# Patient Record
Sex: Female | Born: 1945
Health system: Southern US, Community
[De-identification: ages and names within clinical notes are randomized; demographics above are authoritative.]

## PROBLEM LIST (undated history)

## (undated) DIAGNOSIS — K219 Gastro-esophageal reflux disease without esophagitis: Secondary | ICD-10-CM

## (undated) DIAGNOSIS — J45909 Unspecified asthma, uncomplicated: Secondary | ICD-10-CM

## (undated) DIAGNOSIS — F419 Anxiety disorder, unspecified: Secondary | ICD-10-CM

## (undated) DIAGNOSIS — F329 Major depressive disorder, single episode, unspecified: Secondary | ICD-10-CM

## (undated) DIAGNOSIS — I10 Essential (primary) hypertension: Secondary | ICD-10-CM

## (undated) DIAGNOSIS — E7800 Pure hypercholesterolemia, unspecified: Secondary | ICD-10-CM

## (undated) DIAGNOSIS — M797 Fibromyalgia: Secondary | ICD-10-CM

## (undated) DIAGNOSIS — I471 Supraventricular tachycardia: Secondary | ICD-10-CM

## (undated) DIAGNOSIS — E039 Hypothyroidism, unspecified: Secondary | ICD-10-CM

## (undated) DIAGNOSIS — G8929 Other chronic pain: Secondary | ICD-10-CM

## (undated) DIAGNOSIS — R519 Headache, unspecified: Secondary | ICD-10-CM

## (undated) DIAGNOSIS — M810 Age-related osteoporosis without current pathological fracture: Secondary | ICD-10-CM

## (undated) DIAGNOSIS — K589 Irritable bowel syndrome without diarrhea: Secondary | ICD-10-CM

## (undated) DIAGNOSIS — F32A Depression, unspecified: Secondary | ICD-10-CM

## (undated) DIAGNOSIS — C8590 Non-Hodgkin lymphoma, unspecified, unspecified site: Secondary | ICD-10-CM

## (undated) DIAGNOSIS — M1990 Unspecified osteoarthritis, unspecified site: Secondary | ICD-10-CM

## (undated) DIAGNOSIS — D649 Anemia, unspecified: Secondary | ICD-10-CM

## (undated) DIAGNOSIS — H1010 Acute atopic conjunctivitis, unspecified eye: Secondary | ICD-10-CM

## (undated) DIAGNOSIS — T7840XA Allergy, unspecified, initial encounter: Secondary | ICD-10-CM

## (undated) DIAGNOSIS — I4719 Other supraventricular tachycardia: Secondary | ICD-10-CM

## (undated) DIAGNOSIS — N390 Urinary tract infection, site not specified: Secondary | ICD-10-CM

## (undated) DIAGNOSIS — K635 Polyp of colon: Secondary | ICD-10-CM

## (undated) DIAGNOSIS — R51 Headache: Secondary | ICD-10-CM

## (undated) DIAGNOSIS — K269 Duodenal ulcer, unspecified as acute or chronic, without hemorrhage or perforation: Secondary | ICD-10-CM

## (undated) DIAGNOSIS — J309 Allergic rhinitis, unspecified: Secondary | ICD-10-CM

## (undated) DIAGNOSIS — H269 Unspecified cataract: Secondary | ICD-10-CM

## (undated) HISTORY — DX: Unspecified asthma, uncomplicated: J45.909

## (undated) HISTORY — DX: Allergy, unspecified, initial encounter: T78.40XA

## (undated) HISTORY — DX: Non-Hodgkin lymphoma, unspecified, unspecified site: C85.90

## (undated) HISTORY — DX: Irritable bowel syndrome, unspecified: K58.9

## (undated) HISTORY — DX: Hypothyroidism, unspecified: E03.9

## (undated) HISTORY — DX: Major depressive disorder, single episode, unspecified: F32.9

## (undated) HISTORY — DX: Other chronic pain: G89.29

## (undated) HISTORY — DX: Unspecified cataract: H26.9

## (undated) HISTORY — DX: Fibromyalgia: M79.7

## (undated) HISTORY — PX: UPPER GASTROINTESTINAL ENDOSCOPY: SHX188

## (undated) HISTORY — DX: Other supraventricular tachycardia: I47.19

## (undated) HISTORY — DX: Depression, unspecified: F32.A

## (undated) HISTORY — DX: Acute atopic conjunctivitis, unspecified eye: H10.10

## (undated) HISTORY — DX: Age-related osteoporosis without current pathological fracture: M81.0

## (undated) HISTORY — DX: Essential (primary) hypertension: I10

## (undated) HISTORY — DX: Anemia, unspecified: D64.9

## (undated) HISTORY — DX: Gastro-esophageal reflux disease without esophagitis: K21.9

## (undated) HISTORY — DX: Allergic rhinitis, unspecified: J30.9

## (undated) HISTORY — DX: Unspecified osteoarthritis, unspecified site: M19.90

## (undated) HISTORY — PX: ADENOIDECTOMY: SUR15

## (undated) HISTORY — DX: Duodenal ulcer, unspecified as acute or chronic, without hemorrhage or perforation: K26.9

## (undated) HISTORY — DX: Urinary tract infection, site not specified: N39.0

## (undated) HISTORY — PX: TONSILLECTOMY: SUR1361

## (undated) HISTORY — DX: Anxiety disorder, unspecified: F41.9

## (undated) HISTORY — DX: Headache, unspecified: R51.9

## (undated) HISTORY — DX: Headache: R51

## (undated) HISTORY — DX: Supraventricular tachycardia: I47.1

## (undated) HISTORY — DX: Polyp of colon: K63.5

## (undated) HISTORY — DX: Pure hypercholesterolemia, unspecified: E78.00

---

## 1980-09-10 HISTORY — PX: TUBAL LIGATION: SHX77

## 1982-09-10 HISTORY — PX: THYROIDECTOMY: SHX17

## 1998-09-10 HISTORY — PX: ABLATION: SHX5711

## 2000-09-10 HISTORY — PX: BLADDER SURGERY: SHX569

## 2009-09-10 HISTORY — PX: KNEE ARTHROSCOPY: SUR90

## 2009-12-14 HISTORY — PX: COLONOSCOPY: SHX174

## 2010-06-16 ENCOUNTER — Ambulatory Visit (HOSPITAL_BASED_OUTPATIENT_CLINIC_OR_DEPARTMENT_OTHER): Admission: RE | Admit: 2010-06-16 | Discharge: 2010-06-16 | Payer: Self-pay | Admitting: Specialist

## 2010-11-23 LAB — POCT HEMOGLOBIN-HEMACUE: Hemoglobin: 12.3 g/dL (ref 12.0–15.0)

## 2011-10-31 DIAGNOSIS — D509 Iron deficiency anemia, unspecified: Secondary | ICD-10-CM | POA: Diagnosis not present

## 2011-10-31 DIAGNOSIS — M810 Age-related osteoporosis without current pathological fracture: Secondary | ICD-10-CM | POA: Diagnosis not present

## 2011-10-31 DIAGNOSIS — J0100 Acute maxillary sinusitis, unspecified: Secondary | ICD-10-CM | POA: Diagnosis not present

## 2011-10-31 DIAGNOSIS — R030 Elevated blood-pressure reading, without diagnosis of hypertension: Secondary | ICD-10-CM | POA: Diagnosis not present

## 2011-12-27 DIAGNOSIS — M62838 Other muscle spasm: Secondary | ICD-10-CM | POA: Diagnosis not present

## 2011-12-27 DIAGNOSIS — R5383 Other fatigue: Secondary | ICD-10-CM | POA: Diagnosis not present

## 2011-12-27 DIAGNOSIS — IMO0001 Reserved for inherently not codable concepts without codable children: Secondary | ICD-10-CM | POA: Diagnosis not present

## 2011-12-27 DIAGNOSIS — R5381 Other malaise: Secondary | ICD-10-CM | POA: Diagnosis not present

## 2011-12-27 DIAGNOSIS — M76899 Other specified enthesopathies of unspecified lower limb, excluding foot: Secondary | ICD-10-CM | POA: Diagnosis not present

## 2012-01-28 DIAGNOSIS — R030 Elevated blood-pressure reading, without diagnosis of hypertension: Secondary | ICD-10-CM | POA: Diagnosis not present

## 2012-01-28 DIAGNOSIS — J309 Allergic rhinitis, unspecified: Secondary | ICD-10-CM | POA: Diagnosis not present

## 2012-01-28 DIAGNOSIS — D509 Iron deficiency anemia, unspecified: Secondary | ICD-10-CM | POA: Diagnosis not present

## 2012-01-28 DIAGNOSIS — Z23 Encounter for immunization: Secondary | ICD-10-CM | POA: Diagnosis not present

## 2012-01-28 DIAGNOSIS — E039 Hypothyroidism, unspecified: Secondary | ICD-10-CM | POA: Diagnosis not present

## 2012-01-28 DIAGNOSIS — E7800 Pure hypercholesterolemia, unspecified: Secondary | ICD-10-CM | POA: Diagnosis not present

## 2012-04-30 DIAGNOSIS — M159 Polyosteoarthritis, unspecified: Secondary | ICD-10-CM | POA: Diagnosis not present

## 2012-04-30 DIAGNOSIS — D509 Iron deficiency anemia, unspecified: Secondary | ICD-10-CM | POA: Diagnosis not present

## 2012-04-30 DIAGNOSIS — R030 Elevated blood-pressure reading, without diagnosis of hypertension: Secondary | ICD-10-CM | POA: Diagnosis not present

## 2012-04-30 DIAGNOSIS — E039 Hypothyroidism, unspecified: Secondary | ICD-10-CM | POA: Diagnosis not present

## 2012-07-03 DIAGNOSIS — M542 Cervicalgia: Secondary | ICD-10-CM | POA: Diagnosis not present

## 2012-07-03 DIAGNOSIS — M76899 Other specified enthesopathies of unspecified lower limb, excluding foot: Secondary | ICD-10-CM | POA: Diagnosis not present

## 2012-07-03 DIAGNOSIS — R5383 Other fatigue: Secondary | ICD-10-CM | POA: Diagnosis not present

## 2012-07-03 DIAGNOSIS — G4700 Insomnia, unspecified: Secondary | ICD-10-CM | POA: Diagnosis not present

## 2012-09-08 DIAGNOSIS — Z6826 Body mass index (BMI) 26.0-26.9, adult: Secondary | ICD-10-CM | POA: Diagnosis not present

## 2012-09-08 DIAGNOSIS — R030 Elevated blood-pressure reading, without diagnosis of hypertension: Secondary | ICD-10-CM | POA: Diagnosis not present

## 2012-09-08 DIAGNOSIS — R1011 Right upper quadrant pain: Secondary | ICD-10-CM | POA: Diagnosis not present

## 2012-09-08 DIAGNOSIS — D509 Iron deficiency anemia, unspecified: Secondary | ICD-10-CM | POA: Diagnosis not present

## 2012-09-08 DIAGNOSIS — Z23 Encounter for immunization: Secondary | ICD-10-CM | POA: Diagnosis not present

## 2012-09-11 DIAGNOSIS — R1011 Right upper quadrant pain: Secondary | ICD-10-CM | POA: Diagnosis not present

## 2012-09-22 DIAGNOSIS — E7800 Pure hypercholesterolemia, unspecified: Secondary | ICD-10-CM | POA: Diagnosis not present

## 2012-09-22 DIAGNOSIS — Z6826 Body mass index (BMI) 26.0-26.9, adult: Secondary | ICD-10-CM | POA: Diagnosis not present

## 2012-09-22 DIAGNOSIS — Z1331 Encounter for screening for depression: Secondary | ICD-10-CM | POA: Diagnosis not present

## 2012-09-22 DIAGNOSIS — E118 Type 2 diabetes mellitus with unspecified complications: Secondary | ICD-10-CM | POA: Diagnosis not present

## 2012-09-22 DIAGNOSIS — M810 Age-related osteoporosis without current pathological fracture: Secondary | ICD-10-CM | POA: Diagnosis not present

## 2012-09-22 DIAGNOSIS — D509 Iron deficiency anemia, unspecified: Secondary | ICD-10-CM | POA: Diagnosis not present

## 2012-09-22 DIAGNOSIS — R030 Elevated blood-pressure reading, without diagnosis of hypertension: Secondary | ICD-10-CM | POA: Diagnosis not present

## 2012-09-22 DIAGNOSIS — E039 Hypothyroidism, unspecified: Secondary | ICD-10-CM | POA: Diagnosis not present

## 2012-09-22 DIAGNOSIS — Z9181 History of falling: Secondary | ICD-10-CM | POA: Diagnosis not present

## 2012-10-07 DIAGNOSIS — R1011 Right upper quadrant pain: Secondary | ICD-10-CM | POA: Diagnosis not present

## 2012-10-07 DIAGNOSIS — K589 Irritable bowel syndrome without diarrhea: Secondary | ICD-10-CM | POA: Diagnosis not present

## 2012-10-07 DIAGNOSIS — K219 Gastro-esophageal reflux disease without esophagitis: Secondary | ICD-10-CM | POA: Diagnosis not present

## 2012-11-10 DIAGNOSIS — K219 Gastro-esophageal reflux disease without esophagitis: Secondary | ICD-10-CM | POA: Diagnosis not present

## 2012-11-10 DIAGNOSIS — D131 Benign neoplasm of stomach: Secondary | ICD-10-CM | POA: Diagnosis not present

## 2012-11-10 DIAGNOSIS — J309 Allergic rhinitis, unspecified: Secondary | ICD-10-CM | POA: Diagnosis not present

## 2012-11-10 DIAGNOSIS — Z79899 Other long term (current) drug therapy: Secondary | ICD-10-CM | POA: Diagnosis not present

## 2012-11-10 DIAGNOSIS — D126 Benign neoplasm of colon, unspecified: Secondary | ICD-10-CM | POA: Diagnosis not present

## 2012-11-10 DIAGNOSIS — K589 Irritable bowel syndrome without diarrhea: Secondary | ICD-10-CM | POA: Diagnosis not present

## 2012-11-10 DIAGNOSIS — F329 Major depressive disorder, single episode, unspecified: Secondary | ICD-10-CM | POA: Diagnosis not present

## 2012-11-10 DIAGNOSIS — R1013 Epigastric pain: Secondary | ICD-10-CM | POA: Diagnosis not present

## 2012-11-10 DIAGNOSIS — IMO0001 Reserved for inherently not codable concepts without codable children: Secondary | ICD-10-CM | POA: Diagnosis not present

## 2012-11-10 DIAGNOSIS — E785 Hyperlipidemia, unspecified: Secondary | ICD-10-CM | POA: Diagnosis not present

## 2012-11-10 DIAGNOSIS — E7800 Pure hypercholesterolemia, unspecified: Secondary | ICD-10-CM | POA: Diagnosis not present

## 2012-11-10 DIAGNOSIS — K449 Diaphragmatic hernia without obstruction or gangrene: Secondary | ICD-10-CM | POA: Diagnosis not present

## 2012-11-10 DIAGNOSIS — M818 Other osteoporosis without current pathological fracture: Secondary | ICD-10-CM | POA: Diagnosis not present

## 2012-11-10 DIAGNOSIS — R1011 Right upper quadrant pain: Secondary | ICD-10-CM | POA: Diagnosis not present

## 2012-11-10 HISTORY — PX: ESOPHAGOGASTRODUODENOSCOPY: SHX1529

## 2012-11-12 DIAGNOSIS — R1011 Right upper quadrant pain: Secondary | ICD-10-CM | POA: Diagnosis not present

## 2013-01-09 DIAGNOSIS — M6530 Trigger finger, unspecified finger: Secondary | ICD-10-CM | POA: Diagnosis not present

## 2013-01-09 DIAGNOSIS — R5383 Other fatigue: Secondary | ICD-10-CM | POA: Diagnosis not present

## 2013-01-09 DIAGNOSIS — M19049 Primary osteoarthritis, unspecified hand: Secondary | ICD-10-CM | POA: Diagnosis not present

## 2013-01-09 DIAGNOSIS — IMO0001 Reserved for inherently not codable concepts without codable children: Secondary | ICD-10-CM | POA: Diagnosis not present

## 2013-01-09 DIAGNOSIS — R5381 Other malaise: Secondary | ICD-10-CM | POA: Diagnosis not present

## 2013-01-24 DIAGNOSIS — D509 Iron deficiency anemia, unspecified: Secondary | ICD-10-CM | POA: Diagnosis not present

## 2013-01-24 DIAGNOSIS — M810 Age-related osteoporosis without current pathological fracture: Secondary | ICD-10-CM | POA: Diagnosis not present

## 2013-01-24 DIAGNOSIS — Z6827 Body mass index (BMI) 27.0-27.9, adult: Secondary | ICD-10-CM | POA: Diagnosis not present

## 2013-01-24 DIAGNOSIS — R030 Elevated blood-pressure reading, without diagnosis of hypertension: Secondary | ICD-10-CM | POA: Diagnosis not present

## 2013-03-04 DIAGNOSIS — D509 Iron deficiency anemia, unspecified: Secondary | ICD-10-CM | POA: Diagnosis not present

## 2013-03-04 DIAGNOSIS — Z6828 Body mass index (BMI) 28.0-28.9, adult: Secondary | ICD-10-CM | POA: Diagnosis not present

## 2013-03-04 DIAGNOSIS — R05 Cough: Secondary | ICD-10-CM | POA: Diagnosis not present

## 2013-03-04 DIAGNOSIS — M810 Age-related osteoporosis without current pathological fracture: Secondary | ICD-10-CM | POA: Diagnosis not present

## 2013-03-18 DIAGNOSIS — M810 Age-related osteoporosis without current pathological fracture: Secondary | ICD-10-CM | POA: Diagnosis not present

## 2013-03-18 DIAGNOSIS — Z1231 Encounter for screening mammogram for malignant neoplasm of breast: Secondary | ICD-10-CM | POA: Diagnosis not present

## 2013-04-03 DIAGNOSIS — E7800 Pure hypercholesterolemia, unspecified: Secondary | ICD-10-CM | POA: Diagnosis not present

## 2013-04-03 DIAGNOSIS — Z6828 Body mass index (BMI) 28.0-28.9, adult: Secondary | ICD-10-CM | POA: Diagnosis not present

## 2013-04-03 DIAGNOSIS — Z23 Encounter for immunization: Secondary | ICD-10-CM | POA: Diagnosis not present

## 2013-04-03 DIAGNOSIS — M810 Age-related osteoporosis without current pathological fracture: Secondary | ICD-10-CM | POA: Diagnosis not present

## 2013-04-03 DIAGNOSIS — D509 Iron deficiency anemia, unspecified: Secondary | ICD-10-CM | POA: Diagnosis not present

## 2013-04-03 DIAGNOSIS — R05 Cough: Secondary | ICD-10-CM | POA: Diagnosis not present

## 2013-04-03 DIAGNOSIS — E039 Hypothyroidism, unspecified: Secondary | ICD-10-CM | POA: Diagnosis not present

## 2013-05-04 DIAGNOSIS — E039 Hypothyroidism, unspecified: Secondary | ICD-10-CM | POA: Diagnosis not present

## 2013-05-04 DIAGNOSIS — Z6828 Body mass index (BMI) 28.0-28.9, adult: Secondary | ICD-10-CM | POA: Diagnosis not present

## 2013-05-04 DIAGNOSIS — R030 Elevated blood-pressure reading, without diagnosis of hypertension: Secondary | ICD-10-CM | POA: Diagnosis not present

## 2013-05-04 DIAGNOSIS — D509 Iron deficiency anemia, unspecified: Secondary | ICD-10-CM | POA: Diagnosis not present

## 2013-06-16 DIAGNOSIS — M6530 Trigger finger, unspecified finger: Secondary | ICD-10-CM | POA: Diagnosis not present

## 2013-06-16 DIAGNOSIS — IMO0001 Reserved for inherently not codable concepts without codable children: Secondary | ICD-10-CM | POA: Diagnosis not present

## 2013-06-16 DIAGNOSIS — M25569 Pain in unspecified knee: Secondary | ICD-10-CM | POA: Diagnosis not present

## 2013-06-16 DIAGNOSIS — G4700 Insomnia, unspecified: Secondary | ICD-10-CM | POA: Diagnosis not present

## 2013-08-04 DIAGNOSIS — Z6831 Body mass index (BMI) 31.0-31.9, adult: Secondary | ICD-10-CM | POA: Diagnosis not present

## 2013-08-04 DIAGNOSIS — D509 Iron deficiency anemia, unspecified: Secondary | ICD-10-CM | POA: Diagnosis not present

## 2013-08-04 DIAGNOSIS — R030 Elevated blood-pressure reading, without diagnosis of hypertension: Secondary | ICD-10-CM | POA: Diagnosis not present

## 2013-08-04 DIAGNOSIS — Z23 Encounter for immunization: Secondary | ICD-10-CM | POA: Diagnosis not present

## 2013-08-04 DIAGNOSIS — M810 Age-related osteoporosis without current pathological fracture: Secondary | ICD-10-CM | POA: Diagnosis not present

## 2013-08-12 DIAGNOSIS — M1710 Unilateral primary osteoarthritis, unspecified knee: Secondary | ICD-10-CM | POA: Diagnosis not present

## 2013-08-19 DIAGNOSIS — M1710 Unilateral primary osteoarthritis, unspecified knee: Secondary | ICD-10-CM | POA: Diagnosis not present

## 2013-08-26 DIAGNOSIS — M1710 Unilateral primary osteoarthritis, unspecified knee: Secondary | ICD-10-CM | POA: Diagnosis not present

## 2013-09-02 DIAGNOSIS — M1710 Unilateral primary osteoarthritis, unspecified knee: Secondary | ICD-10-CM | POA: Diagnosis not present

## 2013-09-09 DIAGNOSIS — M1710 Unilateral primary osteoarthritis, unspecified knee: Secondary | ICD-10-CM | POA: Diagnosis not present

## 2013-09-23 DIAGNOSIS — R51 Headache: Secondary | ICD-10-CM | POA: Diagnosis not present

## 2013-11-02 DIAGNOSIS — E7800 Pure hypercholesterolemia, unspecified: Secondary | ICD-10-CM | POA: Diagnosis not present

## 2013-11-02 DIAGNOSIS — D509 Iron deficiency anemia, unspecified: Secondary | ICD-10-CM | POA: Diagnosis not present

## 2013-11-02 DIAGNOSIS — E039 Hypothyroidism, unspecified: Secondary | ICD-10-CM | POA: Diagnosis not present

## 2013-11-02 DIAGNOSIS — R030 Elevated blood-pressure reading, without diagnosis of hypertension: Secondary | ICD-10-CM | POA: Diagnosis not present

## 2013-11-02 DIAGNOSIS — J309 Allergic rhinitis, unspecified: Secondary | ICD-10-CM | POA: Diagnosis not present

## 2013-11-02 DIAGNOSIS — Z1331 Encounter for screening for depression: Secondary | ICD-10-CM | POA: Diagnosis not present

## 2013-11-02 DIAGNOSIS — Z9181 History of falling: Secondary | ICD-10-CM | POA: Diagnosis not present

## 2013-12-15 DIAGNOSIS — IMO0001 Reserved for inherently not codable concepts without codable children: Secondary | ICD-10-CM | POA: Diagnosis not present

## 2013-12-15 DIAGNOSIS — M461 Sacroiliitis, not elsewhere classified: Secondary | ICD-10-CM | POA: Diagnosis not present

## 2013-12-15 DIAGNOSIS — R5381 Other malaise: Secondary | ICD-10-CM | POA: Diagnosis not present

## 2013-12-15 DIAGNOSIS — G4700 Insomnia, unspecified: Secondary | ICD-10-CM | POA: Diagnosis not present

## 2013-12-15 DIAGNOSIS — R5383 Other fatigue: Secondary | ICD-10-CM | POA: Diagnosis not present

## 2014-02-10 DIAGNOSIS — E039 Hypothyroidism, unspecified: Secondary | ICD-10-CM | POA: Diagnosis not present

## 2014-02-10 DIAGNOSIS — R030 Elevated blood-pressure reading, without diagnosis of hypertension: Secondary | ICD-10-CM | POA: Diagnosis not present

## 2014-02-10 DIAGNOSIS — J309 Allergic rhinitis, unspecified: Secondary | ICD-10-CM | POA: Diagnosis not present

## 2014-02-10 DIAGNOSIS — D509 Iron deficiency anemia, unspecified: Secondary | ICD-10-CM | POA: Diagnosis not present

## 2014-05-13 DIAGNOSIS — J309 Allergic rhinitis, unspecified: Secondary | ICD-10-CM | POA: Diagnosis not present

## 2014-05-13 DIAGNOSIS — R030 Elevated blood-pressure reading, without diagnosis of hypertension: Secondary | ICD-10-CM | POA: Diagnosis not present

## 2014-05-13 DIAGNOSIS — E039 Hypothyroidism, unspecified: Secondary | ICD-10-CM | POA: Diagnosis not present

## 2014-05-13 DIAGNOSIS — D509 Iron deficiency anemia, unspecified: Secondary | ICD-10-CM | POA: Diagnosis not present

## 2014-05-13 DIAGNOSIS — E7800 Pure hypercholesterolemia, unspecified: Secondary | ICD-10-CM | POA: Diagnosis not present

## 2014-05-13 DIAGNOSIS — K219 Gastro-esophageal reflux disease without esophagitis: Secondary | ICD-10-CM | POA: Diagnosis not present

## 2014-05-27 DIAGNOSIS — Z1231 Encounter for screening mammogram for malignant neoplasm of breast: Secondary | ICD-10-CM | POA: Diagnosis not present

## 2014-07-05 DIAGNOSIS — M65341 Trigger finger, right ring finger: Secondary | ICD-10-CM | POA: Diagnosis not present

## 2014-07-05 DIAGNOSIS — M170 Bilateral primary osteoarthritis of knee: Secondary | ICD-10-CM | POA: Diagnosis not present

## 2014-07-05 DIAGNOSIS — M65331 Trigger finger, right middle finger: Secondary | ICD-10-CM | POA: Diagnosis not present

## 2014-07-05 DIAGNOSIS — M797 Fibromyalgia: Secondary | ICD-10-CM | POA: Diagnosis not present

## 2014-08-11 DIAGNOSIS — M170 Bilateral primary osteoarthritis of knee: Secondary | ICD-10-CM | POA: Diagnosis not present

## 2014-08-18 DIAGNOSIS — M1712 Unilateral primary osteoarthritis, left knee: Secondary | ICD-10-CM | POA: Diagnosis not present

## 2014-08-18 DIAGNOSIS — K219 Gastro-esophageal reflux disease without esophagitis: Secondary | ICD-10-CM | POA: Diagnosis not present

## 2014-08-18 DIAGNOSIS — M159 Polyosteoarthritis, unspecified: Secondary | ICD-10-CM | POA: Diagnosis not present

## 2014-08-18 DIAGNOSIS — M1711 Unilateral primary osteoarthritis, right knee: Secondary | ICD-10-CM | POA: Diagnosis not present

## 2014-08-18 DIAGNOSIS — E039 Hypothyroidism, unspecified: Secondary | ICD-10-CM | POA: Diagnosis not present

## 2014-08-18 DIAGNOSIS — R030 Elevated blood-pressure reading, without diagnosis of hypertension: Secondary | ICD-10-CM | POA: Diagnosis not present

## 2014-08-18 DIAGNOSIS — Z23 Encounter for immunization: Secondary | ICD-10-CM | POA: Diagnosis not present

## 2014-08-18 DIAGNOSIS — D509 Iron deficiency anemia, unspecified: Secondary | ICD-10-CM | POA: Diagnosis not present

## 2014-08-18 DIAGNOSIS — F329 Major depressive disorder, single episode, unspecified: Secondary | ICD-10-CM | POA: Diagnosis not present

## 2014-08-18 DIAGNOSIS — E780 Pure hypercholesterolemia: Secondary | ICD-10-CM | POA: Diagnosis not present

## 2014-08-18 DIAGNOSIS — D229 Melanocytic nevi, unspecified: Secondary | ICD-10-CM | POA: Diagnosis not present

## 2014-08-18 DIAGNOSIS — J309 Allergic rhinitis, unspecified: Secondary | ICD-10-CM | POA: Diagnosis not present

## 2014-08-25 DIAGNOSIS — M1712 Unilateral primary osteoarthritis, left knee: Secondary | ICD-10-CM | POA: Diagnosis not present

## 2014-08-25 DIAGNOSIS — M1711 Unilateral primary osteoarthritis, right knee: Secondary | ICD-10-CM | POA: Diagnosis not present

## 2014-09-01 DIAGNOSIS — M1711 Unilateral primary osteoarthritis, right knee: Secondary | ICD-10-CM | POA: Diagnosis not present

## 2014-09-01 DIAGNOSIS — M1712 Unilateral primary osteoarthritis, left knee: Secondary | ICD-10-CM | POA: Diagnosis not present

## 2014-09-09 DIAGNOSIS — M1712 Unilateral primary osteoarthritis, left knee: Secondary | ICD-10-CM | POA: Diagnosis not present

## 2014-09-09 DIAGNOSIS — M1711 Unilateral primary osteoarthritis, right knee: Secondary | ICD-10-CM | POA: Diagnosis not present

## 2014-09-10 HISTORY — PX: CATARACT EXTRACTION: SUR2

## 2014-09-20 DIAGNOSIS — H9319 Tinnitus, unspecified ear: Secondary | ICD-10-CM | POA: Diagnosis not present

## 2014-09-20 DIAGNOSIS — H9313 Tinnitus, bilateral: Secondary | ICD-10-CM | POA: Diagnosis not present

## 2014-09-20 DIAGNOSIS — H9042 Sensorineural hearing loss, unilateral, left ear, with unrestricted hearing on the contralateral side: Secondary | ICD-10-CM | POA: Diagnosis not present

## 2014-09-20 DIAGNOSIS — H903 Sensorineural hearing loss, bilateral: Secondary | ICD-10-CM | POA: Diagnosis not present

## 2014-09-20 DIAGNOSIS — H9071 Mixed conductive and sensorineural hearing loss, unilateral, right ear, with unrestricted hearing on the contralateral side: Secondary | ICD-10-CM | POA: Diagnosis not present

## 2014-09-27 DIAGNOSIS — H2513 Age-related nuclear cataract, bilateral: Secondary | ICD-10-CM | POA: Diagnosis not present

## 2014-09-27 DIAGNOSIS — G43809 Other migraine, not intractable, without status migrainosus: Secondary | ICD-10-CM | POA: Diagnosis not present

## 2014-10-06 DIAGNOSIS — L820 Inflamed seborrheic keratosis: Secondary | ICD-10-CM | POA: Diagnosis not present

## 2014-10-06 DIAGNOSIS — D225 Melanocytic nevi of trunk: Secondary | ICD-10-CM | POA: Diagnosis not present

## 2014-11-17 DIAGNOSIS — E039 Hypothyroidism, unspecified: Secondary | ICD-10-CM | POA: Diagnosis not present

## 2014-11-17 DIAGNOSIS — F329 Major depressive disorder, single episode, unspecified: Secondary | ICD-10-CM | POA: Diagnosis not present

## 2014-11-17 DIAGNOSIS — D509 Iron deficiency anemia, unspecified: Secondary | ICD-10-CM | POA: Diagnosis not present

## 2014-11-17 DIAGNOSIS — K219 Gastro-esophageal reflux disease without esophagitis: Secondary | ICD-10-CM | POA: Diagnosis not present

## 2014-11-17 DIAGNOSIS — E669 Obesity, unspecified: Secondary | ICD-10-CM | POA: Diagnosis not present

## 2014-11-17 DIAGNOSIS — E030 Congenital hypothyroidism with diffuse goiter: Secondary | ICD-10-CM | POA: Diagnosis not present

## 2014-11-17 DIAGNOSIS — R030 Elevated blood-pressure reading, without diagnosis of hypertension: Secondary | ICD-10-CM | POA: Diagnosis not present

## 2014-11-17 DIAGNOSIS — Z9181 History of falling: Secondary | ICD-10-CM | POA: Diagnosis not present

## 2014-11-17 DIAGNOSIS — J309 Allergic rhinitis, unspecified: Secondary | ICD-10-CM | POA: Diagnosis not present

## 2014-11-17 DIAGNOSIS — M810 Age-related osteoporosis without current pathological fracture: Secondary | ICD-10-CM | POA: Diagnosis not present

## 2014-11-17 DIAGNOSIS — M159 Polyosteoarthritis, unspecified: Secondary | ICD-10-CM | POA: Diagnosis not present

## 2014-11-17 DIAGNOSIS — E780 Pure hypercholesterolemia: Secondary | ICD-10-CM | POA: Diagnosis not present

## 2014-11-17 DIAGNOSIS — Z1389 Encounter for screening for other disorder: Secondary | ICD-10-CM | POA: Diagnosis not present

## 2014-11-24 DIAGNOSIS — M19042 Primary osteoarthritis, left hand: Secondary | ICD-10-CM | POA: Diagnosis not present

## 2014-11-24 DIAGNOSIS — M19041 Primary osteoarthritis, right hand: Secondary | ICD-10-CM | POA: Diagnosis not present

## 2015-01-11 DIAGNOSIS — H3530 Unspecified macular degeneration: Secondary | ICD-10-CM | POA: Diagnosis not present

## 2015-01-11 DIAGNOSIS — H2511 Age-related nuclear cataract, right eye: Secondary | ICD-10-CM | POA: Diagnosis not present

## 2015-01-17 DIAGNOSIS — E039 Hypothyroidism, unspecified: Secondary | ICD-10-CM | POA: Diagnosis not present

## 2015-01-17 DIAGNOSIS — J209 Acute bronchitis, unspecified: Secondary | ICD-10-CM | POA: Diagnosis not present

## 2015-01-17 DIAGNOSIS — Z6833 Body mass index (BMI) 33.0-33.9, adult: Secondary | ICD-10-CM | POA: Diagnosis not present

## 2015-01-17 DIAGNOSIS — E780 Pure hypercholesterolemia: Secondary | ICD-10-CM | POA: Diagnosis not present

## 2015-01-17 DIAGNOSIS — F329 Major depressive disorder, single episode, unspecified: Secondary | ICD-10-CM | POA: Diagnosis not present

## 2015-01-17 DIAGNOSIS — M159 Polyosteoarthritis, unspecified: Secondary | ICD-10-CM | POA: Diagnosis not present

## 2015-01-17 DIAGNOSIS — E669 Obesity, unspecified: Secondary | ICD-10-CM | POA: Diagnosis not present

## 2015-01-17 DIAGNOSIS — J309 Allergic rhinitis, unspecified: Secondary | ICD-10-CM | POA: Diagnosis not present

## 2015-01-17 DIAGNOSIS — M810 Age-related osteoporosis without current pathological fracture: Secondary | ICD-10-CM | POA: Diagnosis not present

## 2015-01-17 DIAGNOSIS — D509 Iron deficiency anemia, unspecified: Secondary | ICD-10-CM | POA: Diagnosis not present

## 2015-01-17 DIAGNOSIS — K219 Gastro-esophageal reflux disease without esophagitis: Secondary | ICD-10-CM | POA: Diagnosis not present

## 2015-01-17 DIAGNOSIS — R030 Elevated blood-pressure reading, without diagnosis of hypertension: Secondary | ICD-10-CM | POA: Diagnosis not present

## 2015-02-01 DIAGNOSIS — E785 Hyperlipidemia, unspecified: Secondary | ICD-10-CM | POA: Diagnosis not present

## 2015-02-01 DIAGNOSIS — E039 Hypothyroidism, unspecified: Secondary | ICD-10-CM | POA: Diagnosis not present

## 2015-02-01 DIAGNOSIS — M797 Fibromyalgia: Secondary | ICD-10-CM | POA: Diagnosis not present

## 2015-02-01 DIAGNOSIS — H259 Unspecified age-related cataract: Secondary | ICD-10-CM | POA: Diagnosis not present

## 2015-02-01 DIAGNOSIS — I10 Essential (primary) hypertension: Secondary | ICD-10-CM | POA: Diagnosis not present

## 2015-02-01 DIAGNOSIS — K219 Gastro-esophageal reflux disease without esophagitis: Secondary | ICD-10-CM | POA: Diagnosis not present

## 2015-02-01 DIAGNOSIS — Z79899 Other long term (current) drug therapy: Secondary | ICD-10-CM | POA: Diagnosis not present

## 2015-02-01 DIAGNOSIS — H2511 Age-related nuclear cataract, right eye: Secondary | ICD-10-CM | POA: Diagnosis not present

## 2015-02-01 DIAGNOSIS — M810 Age-related osteoporosis without current pathological fracture: Secondary | ICD-10-CM | POA: Diagnosis not present

## 2015-02-01 DIAGNOSIS — Z7982 Long term (current) use of aspirin: Secondary | ICD-10-CM | POA: Diagnosis not present

## 2015-02-17 DIAGNOSIS — J309 Allergic rhinitis, unspecified: Secondary | ICD-10-CM | POA: Diagnosis not present

## 2015-02-17 DIAGNOSIS — F329 Major depressive disorder, single episode, unspecified: Secondary | ICD-10-CM | POA: Diagnosis not present

## 2015-02-17 DIAGNOSIS — D509 Iron deficiency anemia, unspecified: Secondary | ICD-10-CM | POA: Diagnosis not present

## 2015-02-17 DIAGNOSIS — E780 Pure hypercholesterolemia: Secondary | ICD-10-CM | POA: Diagnosis not present

## 2015-02-17 DIAGNOSIS — M159 Polyosteoarthritis, unspecified: Secondary | ICD-10-CM | POA: Diagnosis not present

## 2015-02-17 DIAGNOSIS — R030 Elevated blood-pressure reading, without diagnosis of hypertension: Secondary | ICD-10-CM | POA: Diagnosis not present

## 2015-02-17 DIAGNOSIS — Z6831 Body mass index (BMI) 31.0-31.9, adult: Secondary | ICD-10-CM | POA: Diagnosis not present

## 2015-02-17 DIAGNOSIS — E669 Obesity, unspecified: Secondary | ICD-10-CM | POA: Diagnosis not present

## 2015-02-17 DIAGNOSIS — E039 Hypothyroidism, unspecified: Secondary | ICD-10-CM | POA: Diagnosis not present

## 2015-02-17 DIAGNOSIS — M810 Age-related osteoporosis without current pathological fracture: Secondary | ICD-10-CM | POA: Diagnosis not present

## 2015-02-17 DIAGNOSIS — K219 Gastro-esophageal reflux disease without esophagitis: Secondary | ICD-10-CM | POA: Diagnosis not present

## 2015-03-24 DIAGNOSIS — M5030 Other cervical disc degeneration, unspecified cervical region: Secondary | ICD-10-CM | POA: Diagnosis not present

## 2015-03-24 DIAGNOSIS — Z79899 Other long term (current) drug therapy: Secondary | ICD-10-CM | POA: Diagnosis not present

## 2015-03-24 DIAGNOSIS — M797 Fibromyalgia: Secondary | ICD-10-CM | POA: Diagnosis not present

## 2015-03-24 DIAGNOSIS — M19071 Primary osteoarthritis, right ankle and foot: Secondary | ICD-10-CM | POA: Diagnosis not present

## 2015-03-24 DIAGNOSIS — M5137 Other intervertebral disc degeneration, lumbosacral region: Secondary | ICD-10-CM | POA: Diagnosis not present

## 2015-03-24 DIAGNOSIS — Z036 Encounter for observation for suspected toxic effect from ingested substance ruled out: Secondary | ICD-10-CM | POA: Diagnosis not present

## 2015-04-05 DIAGNOSIS — H259 Unspecified age-related cataract: Secondary | ICD-10-CM | POA: Diagnosis not present

## 2015-04-05 DIAGNOSIS — I10 Essential (primary) hypertension: Secondary | ICD-10-CM | POA: Diagnosis not present

## 2015-04-05 DIAGNOSIS — H2512 Age-related nuclear cataract, left eye: Secondary | ICD-10-CM | POA: Diagnosis not present

## 2015-04-05 DIAGNOSIS — E039 Hypothyroidism, unspecified: Secondary | ICD-10-CM | POA: Diagnosis not present

## 2015-04-05 DIAGNOSIS — E785 Hyperlipidemia, unspecified: Secondary | ICD-10-CM | POA: Diagnosis not present

## 2015-04-05 DIAGNOSIS — Z79899 Other long term (current) drug therapy: Secondary | ICD-10-CM | POA: Diagnosis not present

## 2015-04-05 DIAGNOSIS — Z7982 Long term (current) use of aspirin: Secondary | ICD-10-CM | POA: Diagnosis not present

## 2015-05-10 DIAGNOSIS — H524 Presbyopia: Secondary | ICD-10-CM | POA: Diagnosis not present

## 2015-06-06 DIAGNOSIS — M810 Age-related osteoporosis without current pathological fracture: Secondary | ICD-10-CM | POA: Diagnosis not present

## 2015-06-06 DIAGNOSIS — E780 Pure hypercholesterolemia: Secondary | ICD-10-CM | POA: Diagnosis not present

## 2015-06-06 DIAGNOSIS — M159 Polyosteoarthritis, unspecified: Secondary | ICD-10-CM | POA: Diagnosis not present

## 2015-06-06 DIAGNOSIS — K219 Gastro-esophageal reflux disease without esophagitis: Secondary | ICD-10-CM | POA: Diagnosis not present

## 2015-06-06 DIAGNOSIS — F329 Major depressive disorder, single episode, unspecified: Secondary | ICD-10-CM | POA: Diagnosis not present

## 2015-06-06 DIAGNOSIS — R030 Elevated blood-pressure reading, without diagnosis of hypertension: Secondary | ICD-10-CM | POA: Diagnosis not present

## 2015-06-06 DIAGNOSIS — Z1231 Encounter for screening mammogram for malignant neoplasm of breast: Secondary | ICD-10-CM | POA: Diagnosis not present

## 2015-06-06 DIAGNOSIS — J309 Allergic rhinitis, unspecified: Secondary | ICD-10-CM | POA: Diagnosis not present

## 2015-06-06 DIAGNOSIS — Z6831 Body mass index (BMI) 31.0-31.9, adult: Secondary | ICD-10-CM | POA: Diagnosis not present

## 2015-06-06 DIAGNOSIS — D509 Iron deficiency anemia, unspecified: Secondary | ICD-10-CM | POA: Diagnosis not present

## 2015-06-06 DIAGNOSIS — E039 Hypothyroidism, unspecified: Secondary | ICD-10-CM | POA: Diagnosis not present

## 2015-06-06 DIAGNOSIS — E669 Obesity, unspecified: Secondary | ICD-10-CM | POA: Diagnosis not present

## 2015-06-13 DIAGNOSIS — D464 Refractory anemia, unspecified: Secondary | ICD-10-CM | POA: Diagnosis not present

## 2015-06-13 DIAGNOSIS — E039 Hypothyroidism, unspecified: Secondary | ICD-10-CM | POA: Diagnosis not present

## 2015-06-13 DIAGNOSIS — F329 Major depressive disorder, single episode, unspecified: Secondary | ICD-10-CM | POA: Diagnosis not present

## 2015-06-13 DIAGNOSIS — K219 Gastro-esophageal reflux disease without esophagitis: Secondary | ICD-10-CM | POA: Diagnosis not present

## 2015-06-13 DIAGNOSIS — Z0000 Encounter for general adult medical examination without abnormal findings: Secondary | ICD-10-CM | POA: Diagnosis not present

## 2015-06-13 DIAGNOSIS — R030 Elevated blood-pressure reading, without diagnosis of hypertension: Secondary | ICD-10-CM | POA: Diagnosis not present

## 2015-06-13 DIAGNOSIS — M159 Polyosteoarthritis, unspecified: Secondary | ICD-10-CM | POA: Diagnosis not present

## 2015-06-13 DIAGNOSIS — J309 Allergic rhinitis, unspecified: Secondary | ICD-10-CM | POA: Diagnosis not present

## 2015-06-13 DIAGNOSIS — M810 Age-related osteoporosis without current pathological fracture: Secondary | ICD-10-CM | POA: Diagnosis not present

## 2015-06-13 DIAGNOSIS — N951 Menopausal and female climacteric states: Secondary | ICD-10-CM | POA: Diagnosis not present

## 2015-06-13 DIAGNOSIS — E669 Obesity, unspecified: Secondary | ICD-10-CM | POA: Diagnosis not present

## 2015-06-21 DIAGNOSIS — R030 Elevated blood-pressure reading, without diagnosis of hypertension: Secondary | ICD-10-CM | POA: Diagnosis not present

## 2015-06-21 DIAGNOSIS — F329 Major depressive disorder, single episode, unspecified: Secondary | ICD-10-CM | POA: Diagnosis not present

## 2015-06-21 DIAGNOSIS — D464 Refractory anemia, unspecified: Secondary | ICD-10-CM | POA: Diagnosis not present

## 2015-06-21 DIAGNOSIS — E039 Hypothyroidism, unspecified: Secondary | ICD-10-CM | POA: Diagnosis not present

## 2015-06-21 DIAGNOSIS — M159 Polyosteoarthritis, unspecified: Secondary | ICD-10-CM | POA: Diagnosis not present

## 2015-06-21 DIAGNOSIS — J309 Allergic rhinitis, unspecified: Secondary | ICD-10-CM | POA: Diagnosis not present

## 2015-06-21 DIAGNOSIS — Z23 Encounter for immunization: Secondary | ICD-10-CM | POA: Diagnosis not present

## 2015-06-21 DIAGNOSIS — E7800 Pure hypercholesterolemia, unspecified: Secondary | ICD-10-CM | POA: Diagnosis not present

## 2015-06-21 DIAGNOSIS — K219 Gastro-esophageal reflux disease without esophagitis: Secondary | ICD-10-CM | POA: Diagnosis not present

## 2015-06-21 DIAGNOSIS — M810 Age-related osteoporosis without current pathological fracture: Secondary | ICD-10-CM | POA: Diagnosis not present

## 2015-06-21 DIAGNOSIS — E669 Obesity, unspecified: Secondary | ICD-10-CM | POA: Diagnosis not present

## 2015-06-21 DIAGNOSIS — Z6831 Body mass index (BMI) 31.0-31.9, adult: Secondary | ICD-10-CM | POA: Diagnosis not present

## 2015-06-24 DIAGNOSIS — M8589 Other specified disorders of bone density and structure, multiple sites: Secondary | ICD-10-CM | POA: Diagnosis not present

## 2015-06-24 DIAGNOSIS — Z13820 Encounter for screening for osteoporosis: Secondary | ICD-10-CM | POA: Diagnosis not present

## 2015-06-30 DIAGNOSIS — N63 Unspecified lump in breast: Secondary | ICD-10-CM | POA: Diagnosis not present

## 2015-08-17 DIAGNOSIS — H02873 Vascular anomalies of right eye, unspecified eyelid: Secondary | ICD-10-CM | POA: Diagnosis not present

## 2015-09-06 DIAGNOSIS — Z6832 Body mass index (BMI) 32.0-32.9, adult: Secondary | ICD-10-CM | POA: Diagnosis not present

## 2015-09-06 DIAGNOSIS — D464 Refractory anemia, unspecified: Secondary | ICD-10-CM | POA: Diagnosis not present

## 2015-09-06 DIAGNOSIS — M159 Polyosteoarthritis, unspecified: Secondary | ICD-10-CM | POA: Diagnosis not present

## 2015-09-06 DIAGNOSIS — D644 Congenital dyserythropoietic anemia: Secondary | ICD-10-CM | POA: Diagnosis not present

## 2015-09-06 DIAGNOSIS — R030 Elevated blood-pressure reading, without diagnosis of hypertension: Secondary | ICD-10-CM | POA: Diagnosis not present

## 2015-09-06 DIAGNOSIS — E669 Obesity, unspecified: Secondary | ICD-10-CM | POA: Diagnosis not present

## 2015-09-06 DIAGNOSIS — J209 Acute bronchitis, unspecified: Secondary | ICD-10-CM | POA: Diagnosis not present

## 2015-09-06 DIAGNOSIS — E7800 Pure hypercholesterolemia, unspecified: Secondary | ICD-10-CM | POA: Diagnosis not present

## 2015-09-06 DIAGNOSIS — J309 Allergic rhinitis, unspecified: Secondary | ICD-10-CM | POA: Diagnosis not present

## 2015-09-06 DIAGNOSIS — K219 Gastro-esophageal reflux disease without esophagitis: Secondary | ICD-10-CM | POA: Diagnosis not present

## 2015-09-06 DIAGNOSIS — E039 Hypothyroidism, unspecified: Secondary | ICD-10-CM | POA: Diagnosis not present

## 2015-09-06 DIAGNOSIS — F329 Major depressive disorder, single episode, unspecified: Secondary | ICD-10-CM | POA: Diagnosis not present

## 2015-09-06 DIAGNOSIS — M810 Age-related osteoporosis without current pathological fracture: Secondary | ICD-10-CM | POA: Diagnosis not present

## 2015-09-22 DIAGNOSIS — M19071 Primary osteoarthritis, right ankle and foot: Secondary | ICD-10-CM | POA: Diagnosis not present

## 2015-09-22 DIAGNOSIS — M5030 Other cervical disc degeneration, unspecified cervical region: Secondary | ICD-10-CM | POA: Diagnosis not present

## 2015-09-22 DIAGNOSIS — M5137 Other intervertebral disc degeneration, lumbosacral region: Secondary | ICD-10-CM | POA: Diagnosis not present

## 2015-09-22 DIAGNOSIS — R5383 Other fatigue: Secondary | ICD-10-CM | POA: Diagnosis not present

## 2015-12-05 DIAGNOSIS — M810 Age-related osteoporosis without current pathological fracture: Secondary | ICD-10-CM | POA: Diagnosis not present

## 2015-12-05 DIAGNOSIS — M159 Polyosteoarthritis, unspecified: Secondary | ICD-10-CM | POA: Diagnosis not present

## 2015-12-05 DIAGNOSIS — F329 Major depressive disorder, single episode, unspecified: Secondary | ICD-10-CM | POA: Diagnosis not present

## 2015-12-05 DIAGNOSIS — J309 Allergic rhinitis, unspecified: Secondary | ICD-10-CM | POA: Diagnosis not present

## 2015-12-05 DIAGNOSIS — D644 Congenital dyserythropoietic anemia: Secondary | ICD-10-CM | POA: Diagnosis not present

## 2015-12-05 DIAGNOSIS — E039 Hypothyroidism, unspecified: Secondary | ICD-10-CM | POA: Diagnosis not present

## 2015-12-05 DIAGNOSIS — Z1389 Encounter for screening for other disorder: Secondary | ICD-10-CM | POA: Diagnosis not present

## 2015-12-05 DIAGNOSIS — Z6832 Body mass index (BMI) 32.0-32.9, adult: Secondary | ICD-10-CM | POA: Diagnosis not present

## 2015-12-05 DIAGNOSIS — D464 Refractory anemia, unspecified: Secondary | ICD-10-CM | POA: Diagnosis not present

## 2015-12-05 DIAGNOSIS — R030 Elevated blood-pressure reading, without diagnosis of hypertension: Secondary | ICD-10-CM | POA: Diagnosis not present

## 2015-12-05 DIAGNOSIS — E669 Obesity, unspecified: Secondary | ICD-10-CM | POA: Diagnosis not present

## 2015-12-05 DIAGNOSIS — E7800 Pure hypercholesterolemia, unspecified: Secondary | ICD-10-CM | POA: Diagnosis not present

## 2015-12-05 DIAGNOSIS — K219 Gastro-esophageal reflux disease without esophagitis: Secondary | ICD-10-CM | POA: Diagnosis not present

## 2015-12-20 DIAGNOSIS — H02873 Vascular anomalies of right eye, unspecified eyelid: Secondary | ICD-10-CM | POA: Diagnosis not present

## 2016-02-07 DIAGNOSIS — M25562 Pain in left knee: Secondary | ICD-10-CM | POA: Diagnosis not present

## 2016-02-07 DIAGNOSIS — M545 Low back pain: Secondary | ICD-10-CM | POA: Diagnosis not present

## 2016-02-09 ENCOUNTER — Other Ambulatory Visit (HOSPITAL_COMMUNITY): Payer: Self-pay | Admitting: Rheumatology

## 2016-02-09 DIAGNOSIS — M25562 Pain in left knee: Secondary | ICD-10-CM

## 2016-02-16 ENCOUNTER — Ambulatory Visit (HOSPITAL_COMMUNITY)
Admission: RE | Admit: 2016-02-16 | Discharge: 2016-02-16 | Disposition: A | Discharge status: Home / Self Care | Payer: Medicare Other | Source: Ambulatory Visit | Attending: Rheumatology | Admitting: Rheumatology

## 2016-02-16 DIAGNOSIS — S83242A Other tear of medial meniscus, current injury, left knee, initial encounter: Secondary | ICD-10-CM | POA: Diagnosis not present

## 2016-02-16 DIAGNOSIS — M1712 Unilateral primary osteoarthritis, left knee: Secondary | ICD-10-CM | POA: Insufficient documentation

## 2016-02-16 DIAGNOSIS — M25562 Pain in left knee: Secondary | ICD-10-CM | POA: Diagnosis not present

## 2016-02-16 DIAGNOSIS — M2392 Unspecified internal derangement of left knee: Secondary | ICD-10-CM | POA: Insufficient documentation

## 2016-02-20 DIAGNOSIS — M25562 Pain in left knee: Secondary | ICD-10-CM | POA: Diagnosis not present

## 2016-06-29 DIAGNOSIS — Z6832 Body mass index (BMI) 32.0-32.9, adult: Secondary | ICD-10-CM | POA: Diagnosis not present

## 2016-06-29 DIAGNOSIS — K219 Gastro-esophageal reflux disease without esophagitis: Secondary | ICD-10-CM | POA: Diagnosis not present

## 2016-06-29 DIAGNOSIS — J309 Allergic rhinitis, unspecified: Secondary | ICD-10-CM | POA: Diagnosis not present

## 2016-06-29 DIAGNOSIS — E039 Hypothyroidism, unspecified: Secondary | ICD-10-CM | POA: Diagnosis not present

## 2016-06-29 DIAGNOSIS — M810 Age-related osteoporosis without current pathological fracture: Secondary | ICD-10-CM | POA: Diagnosis not present

## 2016-06-29 DIAGNOSIS — F329 Major depressive disorder, single episode, unspecified: Secondary | ICD-10-CM | POA: Diagnosis not present

## 2016-06-29 DIAGNOSIS — J209 Acute bronchitis, unspecified: Secondary | ICD-10-CM | POA: Diagnosis not present

## 2016-06-29 DIAGNOSIS — Z9181 History of falling: Secondary | ICD-10-CM | POA: Diagnosis not present

## 2016-06-29 DIAGNOSIS — R030 Elevated blood-pressure reading, without diagnosis of hypertension: Secondary | ICD-10-CM | POA: Diagnosis not present

## 2016-06-29 DIAGNOSIS — D464 Refractory anemia, unspecified: Secondary | ICD-10-CM | POA: Diagnosis not present

## 2016-06-29 DIAGNOSIS — M159 Polyosteoarthritis, unspecified: Secondary | ICD-10-CM | POA: Diagnosis not present

## 2016-06-29 DIAGNOSIS — E7800 Pure hypercholesterolemia, unspecified: Secondary | ICD-10-CM | POA: Diagnosis not present

## 2016-07-16 DIAGNOSIS — J309 Allergic rhinitis, unspecified: Secondary | ICD-10-CM | POA: Diagnosis not present

## 2016-07-16 DIAGNOSIS — K219 Gastro-esophageal reflux disease without esophagitis: Secondary | ICD-10-CM | POA: Diagnosis not present

## 2016-07-16 DIAGNOSIS — E039 Hypothyroidism, unspecified: Secondary | ICD-10-CM | POA: Diagnosis not present

## 2016-07-16 DIAGNOSIS — E7800 Pure hypercholesterolemia, unspecified: Secondary | ICD-10-CM | POA: Diagnosis not present

## 2016-07-16 DIAGNOSIS — D464 Refractory anemia, unspecified: Secondary | ICD-10-CM | POA: Diagnosis not present

## 2016-07-16 DIAGNOSIS — F329 Major depressive disorder, single episode, unspecified: Secondary | ICD-10-CM | POA: Diagnosis not present

## 2016-07-16 DIAGNOSIS — M810 Age-related osteoporosis without current pathological fracture: Secondary | ICD-10-CM | POA: Diagnosis not present

## 2016-07-16 DIAGNOSIS — R030 Elevated blood-pressure reading, without diagnosis of hypertension: Secondary | ICD-10-CM | POA: Diagnosis not present

## 2016-07-16 DIAGNOSIS — Z23 Encounter for immunization: Secondary | ICD-10-CM | POA: Diagnosis not present

## 2016-07-16 DIAGNOSIS — M159 Polyosteoarthritis, unspecified: Secondary | ICD-10-CM | POA: Diagnosis not present

## 2016-07-16 DIAGNOSIS — Z6832 Body mass index (BMI) 32.0-32.9, adult: Secondary | ICD-10-CM | POA: Diagnosis not present

## 2016-07-16 DIAGNOSIS — E669 Obesity, unspecified: Secondary | ICD-10-CM | POA: Diagnosis not present

## 2016-10-16 DIAGNOSIS — H02873 Vascular anomalies of right eye, unspecified eyelid: Secondary | ICD-10-CM | POA: Diagnosis not present

## 2016-10-16 DIAGNOSIS — H26493 Other secondary cataract, bilateral: Secondary | ICD-10-CM | POA: Diagnosis not present

## 2016-10-16 DIAGNOSIS — H524 Presbyopia: Secondary | ICD-10-CM | POA: Diagnosis not present

## 2016-10-17 DIAGNOSIS — F329 Major depressive disorder, single episode, unspecified: Secondary | ICD-10-CM | POA: Diagnosis not present

## 2016-10-17 DIAGNOSIS — D464 Refractory anemia, unspecified: Secondary | ICD-10-CM | POA: Diagnosis not present

## 2016-10-17 DIAGNOSIS — J309 Allergic rhinitis, unspecified: Secondary | ICD-10-CM | POA: Diagnosis not present

## 2016-10-17 DIAGNOSIS — E039 Hypothyroidism, unspecified: Secondary | ICD-10-CM | POA: Diagnosis not present

## 2016-10-17 DIAGNOSIS — M810 Age-related osteoporosis without current pathological fracture: Secondary | ICD-10-CM | POA: Diagnosis not present

## 2016-10-17 DIAGNOSIS — R030 Elevated blood-pressure reading, without diagnosis of hypertension: Secondary | ICD-10-CM | POA: Diagnosis not present

## 2016-10-17 DIAGNOSIS — E669 Obesity, unspecified: Secondary | ICD-10-CM | POA: Diagnosis not present

## 2016-10-17 DIAGNOSIS — K219 Gastro-esophageal reflux disease without esophagitis: Secondary | ICD-10-CM | POA: Diagnosis not present

## 2016-10-17 DIAGNOSIS — Z6832 Body mass index (BMI) 32.0-32.9, adult: Secondary | ICD-10-CM | POA: Diagnosis not present

## 2016-10-17 DIAGNOSIS — M159 Polyosteoarthritis, unspecified: Secondary | ICD-10-CM | POA: Diagnosis not present

## 2016-10-17 DIAGNOSIS — E7800 Pure hypercholesterolemia, unspecified: Secondary | ICD-10-CM | POA: Diagnosis not present

## 2016-11-07 DIAGNOSIS — H02822 Cysts of right lower eyelid: Secondary | ICD-10-CM | POA: Diagnosis not present

## 2017-01-15 DIAGNOSIS — D464 Refractory anemia, unspecified: Secondary | ICD-10-CM | POA: Diagnosis not present

## 2017-01-15 DIAGNOSIS — R030 Elevated blood-pressure reading, without diagnosis of hypertension: Secondary | ICD-10-CM | POA: Diagnosis not present

## 2017-01-15 DIAGNOSIS — M159 Polyosteoarthritis, unspecified: Secondary | ICD-10-CM | POA: Diagnosis not present

## 2017-01-15 DIAGNOSIS — J309 Allergic rhinitis, unspecified: Secondary | ICD-10-CM | POA: Diagnosis not present

## 2017-01-15 DIAGNOSIS — M810 Age-related osteoporosis without current pathological fracture: Secondary | ICD-10-CM | POA: Diagnosis not present

## 2017-01-15 DIAGNOSIS — E669 Obesity, unspecified: Secondary | ICD-10-CM | POA: Diagnosis not present

## 2017-01-15 DIAGNOSIS — K219 Gastro-esophageal reflux disease without esophagitis: Secondary | ICD-10-CM | POA: Diagnosis not present

## 2017-01-15 DIAGNOSIS — Z6833 Body mass index (BMI) 33.0-33.9, adult: Secondary | ICD-10-CM | POA: Diagnosis not present

## 2017-01-15 DIAGNOSIS — F341 Dysthymic disorder: Secondary | ICD-10-CM | POA: Diagnosis not present

## 2017-01-15 DIAGNOSIS — E7800 Pure hypercholesterolemia, unspecified: Secondary | ICD-10-CM | POA: Diagnosis not present

## 2017-01-15 DIAGNOSIS — E039 Hypothyroidism, unspecified: Secondary | ICD-10-CM | POA: Diagnosis not present

## 2017-01-17 IMAGING — MR MR KNEE*L* W/O CM
4 of 6 series · 19 of 40 positions shown · non-contrast
Comparison: None.

CLINICAL DATA: Left knee pain for several months which is worse
with straightening. No known injury.

EXAM:
MRI OF THE LEFT KNEE WITHOUT CONTRAST
TECHNIQUE: Multiplanar, multisequence MR imaging of the knee was performed. No
intravenous contrast was administered.

[Series 2: PD fat-sat · axial · 4.0mm · 0.29mm/px · z∈[-49,+85]mm · 8 of 28 slices shown (1 of 4)]
[im 1/28]
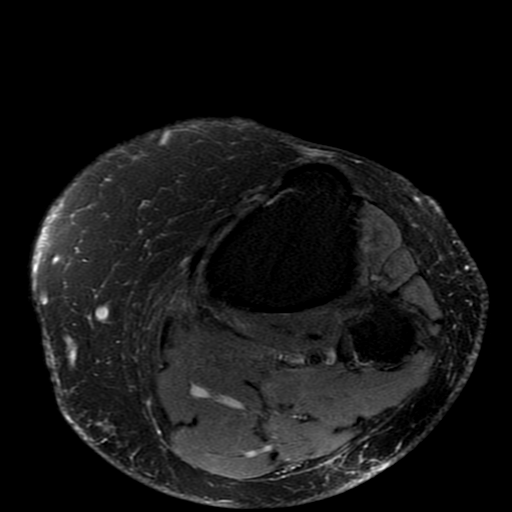
[im 4/28]
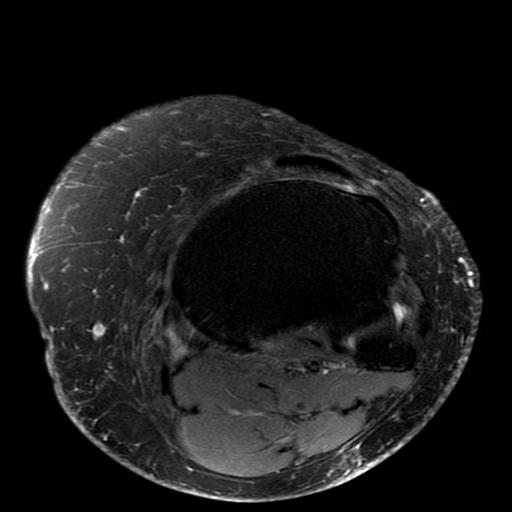
[im 8/28]
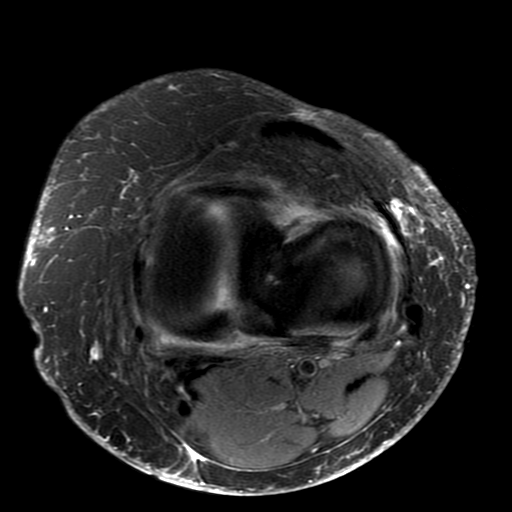
[im 12/28]
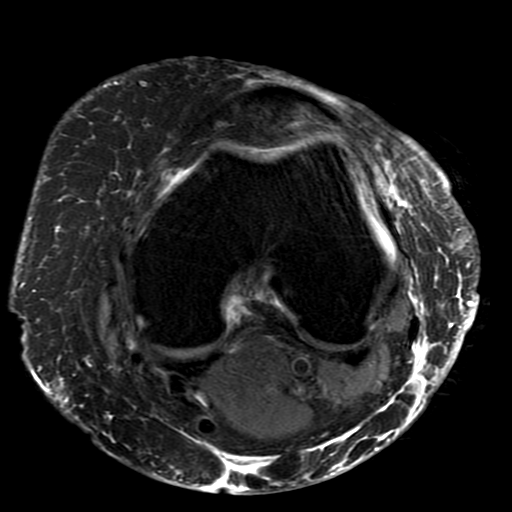
[im 16/28]
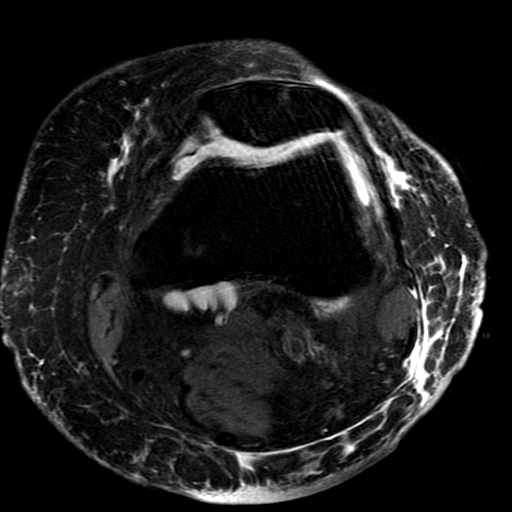
[im 20/28]
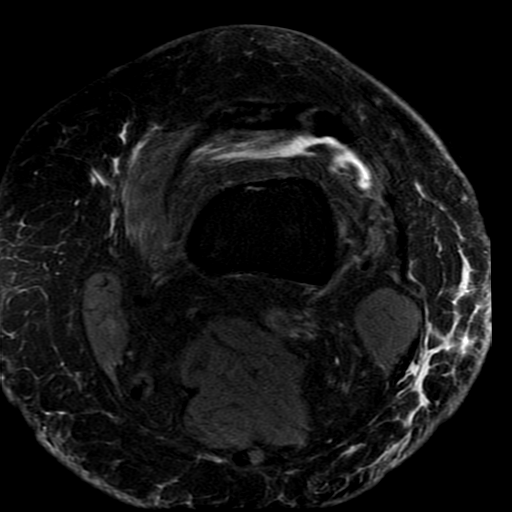
[im 24/28]
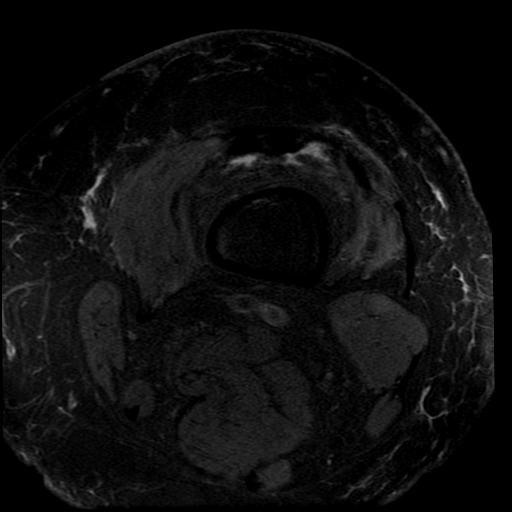
[im 28/28]
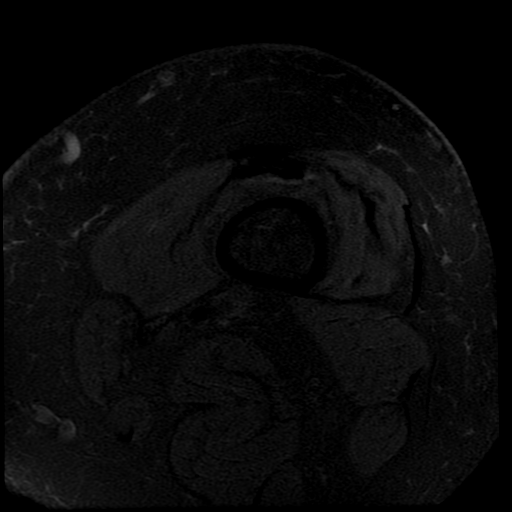

[Series 3: PD fat-sat · coronal · 4.0mm · 0.31mm/px · 5 of 20 slices shown (2 of 4)]
[im 1/20]
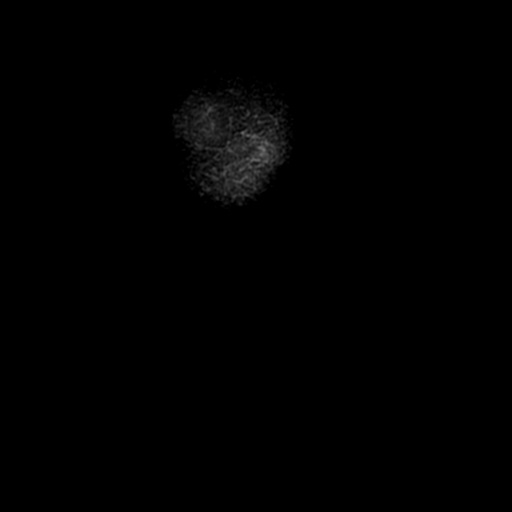
[im 4/20]
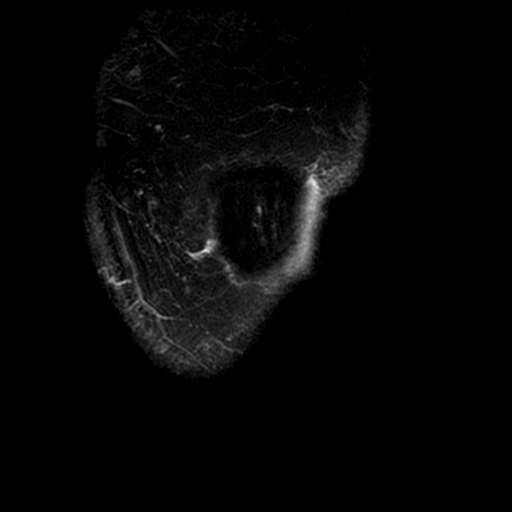
[im 7/20]
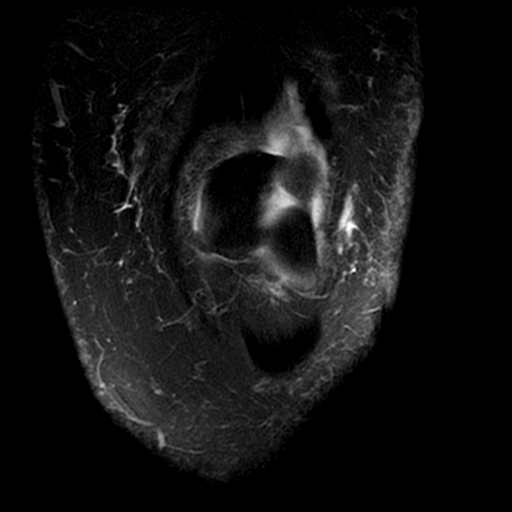
[im 10/20]
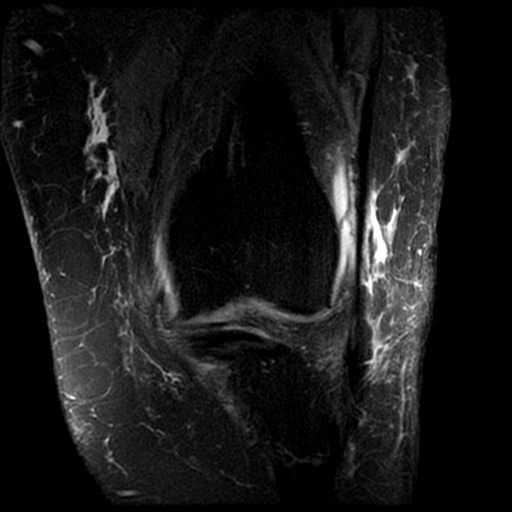
[im 16/20]
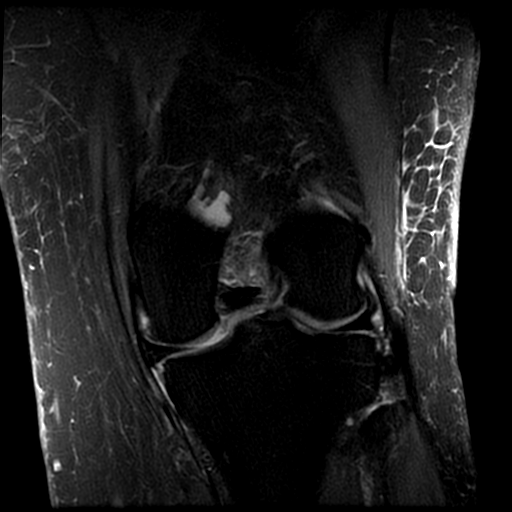

[Series 5: PD fat-sat · sagittal · 4.0mm · 0.31mm/px · 3 of 22 slices shown (3 of 4)]
[im 4/22]
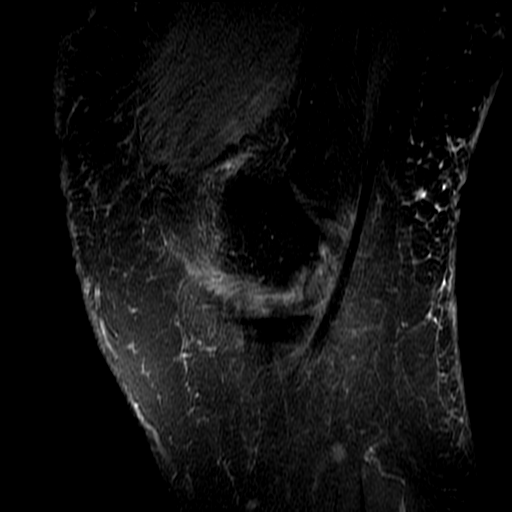
[im 11/22]
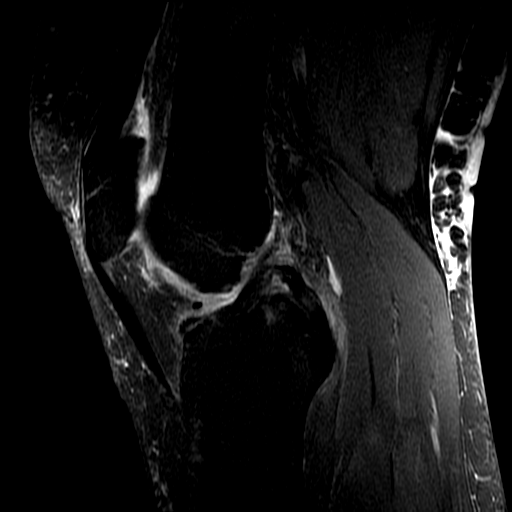
[im 18/22]
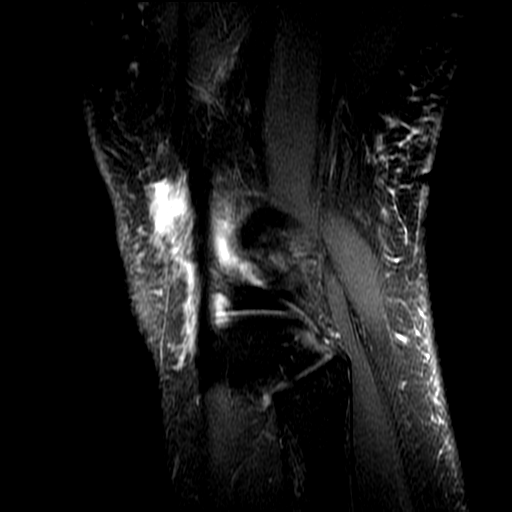

[Series 7: PD fat-sat · coronal · 2.0mm · 0.31mm/px · 3 of 12 slices shown (4 of 4)]
[im 1/12]
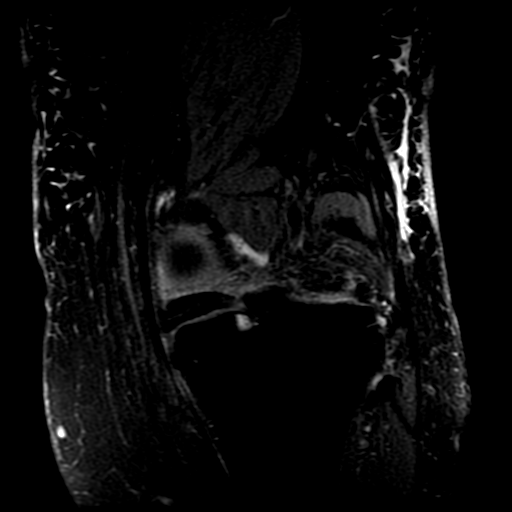
[im 8/12]
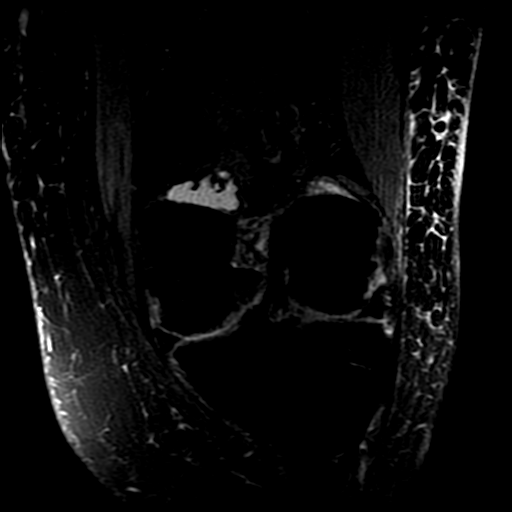
[im 12/12]
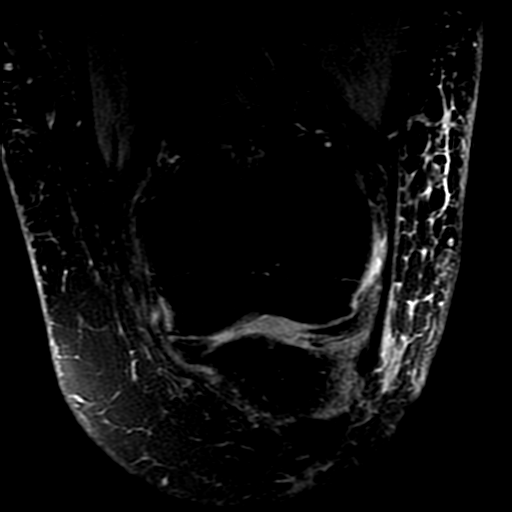

[19 of 40 positions shown; findings below may reference images not displayed]

FINDINGS: MENISCI

Medial meniscus: There is a radial tear at the root of the posterior
horn of the medial meniscus. The medial meniscus is otherwise
intact.

Lateral meniscus:  Intact.

LIGAMENTS

Cruciates:  Intact.

Collaterals:  Intact.

CARTILAGE

Patellofemoral: Hyaline cartilage thinning is most notable along the
medial patellar facet in the superior pole and inferior aspect of
the lateral femoral trochlea where there are some small subchondral
cysts.

Medial:  Unremarkable.

Lateral:  Unremarkable.

Joint:  Small joint effusion.

Popliteal Fossa:  No Baker's cyst.

Extensor Mechanism:  Intact.

Bones: Small osteophytes are seen about the knee. No fracture or
worrisome marrow lesion.
IMPRESSION: Radial tear at the root of the posterior horn of the medial
meniscus.

Mild to moderate patellofemoral osteoarthritis.

## 2017-04-17 DIAGNOSIS — D644 Congenital dyserythropoietic anemia: Secondary | ICD-10-CM | POA: Diagnosis not present

## 2017-04-17 DIAGNOSIS — R030 Elevated blood-pressure reading, without diagnosis of hypertension: Secondary | ICD-10-CM | POA: Diagnosis not present

## 2017-04-17 DIAGNOSIS — M810 Age-related osteoporosis without current pathological fracture: Secondary | ICD-10-CM | POA: Diagnosis not present

## 2017-04-17 DIAGNOSIS — Z6833 Body mass index (BMI) 33.0-33.9, adult: Secondary | ICD-10-CM | POA: Diagnosis not present

## 2017-04-17 DIAGNOSIS — E7800 Pure hypercholesterolemia, unspecified: Secondary | ICD-10-CM | POA: Diagnosis not present

## 2017-04-17 DIAGNOSIS — E039 Hypothyroidism, unspecified: Secondary | ICD-10-CM | POA: Diagnosis not present

## 2017-04-17 DIAGNOSIS — F341 Dysthymic disorder: Secondary | ICD-10-CM | POA: Diagnosis not present

## 2017-04-17 DIAGNOSIS — D464 Refractory anemia, unspecified: Secondary | ICD-10-CM | POA: Diagnosis not present

## 2017-04-17 DIAGNOSIS — M159 Polyosteoarthritis, unspecified: Secondary | ICD-10-CM | POA: Diagnosis not present

## 2017-04-17 DIAGNOSIS — E669 Obesity, unspecified: Secondary | ICD-10-CM | POA: Diagnosis not present

## 2017-04-17 DIAGNOSIS — K219 Gastro-esophageal reflux disease without esophagitis: Secondary | ICD-10-CM | POA: Diagnosis not present

## 2017-04-17 DIAGNOSIS — J309 Allergic rhinitis, unspecified: Secondary | ICD-10-CM | POA: Diagnosis not present

## 2017-05-01 DIAGNOSIS — M810 Age-related osteoporosis without current pathological fracture: Secondary | ICD-10-CM | POA: Diagnosis not present

## 2017-05-01 DIAGNOSIS — R030 Elevated blood-pressure reading, without diagnosis of hypertension: Secondary | ICD-10-CM | POA: Diagnosis not present

## 2017-05-01 DIAGNOSIS — D464 Refractory anemia, unspecified: Secondary | ICD-10-CM | POA: Diagnosis not present

## 2017-05-01 DIAGNOSIS — F341 Dysthymic disorder: Secondary | ICD-10-CM | POA: Diagnosis not present

## 2017-05-01 DIAGNOSIS — M159 Polyosteoarthritis, unspecified: Secondary | ICD-10-CM | POA: Diagnosis not present

## 2017-05-01 DIAGNOSIS — K219 Gastro-esophageal reflux disease without esophagitis: Secondary | ICD-10-CM | POA: Diagnosis not present

## 2017-05-01 DIAGNOSIS — J310 Chronic rhinitis: Secondary | ICD-10-CM | POA: Diagnosis not present

## 2017-05-01 DIAGNOSIS — E039 Hypothyroidism, unspecified: Secondary | ICD-10-CM | POA: Diagnosis not present

## 2017-05-01 DIAGNOSIS — Z6832 Body mass index (BMI) 32.0-32.9, adult: Secondary | ICD-10-CM | POA: Diagnosis not present

## 2017-05-14 DIAGNOSIS — J342 Deviated nasal septum: Secondary | ICD-10-CM | POA: Diagnosis not present

## 2017-05-14 DIAGNOSIS — J329 Chronic sinusitis, unspecified: Secondary | ICD-10-CM | POA: Diagnosis not present

## 2017-05-14 DIAGNOSIS — E890 Postprocedural hypothyroidism: Secondary | ICD-10-CM | POA: Diagnosis not present

## 2017-05-14 DIAGNOSIS — R0981 Nasal congestion: Secondary | ICD-10-CM | POA: Diagnosis not present

## 2017-05-14 DIAGNOSIS — J3489 Other specified disorders of nose and nasal sinuses: Secondary | ICD-10-CM | POA: Diagnosis not present

## 2017-05-14 DIAGNOSIS — H9193 Unspecified hearing loss, bilateral: Secondary | ICD-10-CM | POA: Diagnosis not present

## 2017-05-22 DIAGNOSIS — J329 Chronic sinusitis, unspecified: Secondary | ICD-10-CM | POA: Diagnosis not present

## 2017-05-30 DIAGNOSIS — J3489 Other specified disorders of nose and nasal sinuses: Secondary | ICD-10-CM | POA: Diagnosis not present

## 2017-05-30 DIAGNOSIS — J342 Deviated nasal septum: Secondary | ICD-10-CM | POA: Diagnosis not present

## 2017-05-30 DIAGNOSIS — Z889 Allergy status to unspecified drugs, medicaments and biological substances status: Secondary | ICD-10-CM | POA: Diagnosis not present

## 2017-05-30 DIAGNOSIS — R51 Headache: Secondary | ICD-10-CM | POA: Diagnosis not present

## 2017-07-19 DIAGNOSIS — Z23 Encounter for immunization: Secondary | ICD-10-CM | POA: Diagnosis not present

## 2017-08-30 DIAGNOSIS — E7800 Pure hypercholesterolemia, unspecified: Secondary | ICD-10-CM | POA: Diagnosis not present

## 2017-08-30 DIAGNOSIS — M810 Age-related osteoporosis without current pathological fracture: Secondary | ICD-10-CM | POA: Diagnosis not present

## 2017-08-30 DIAGNOSIS — F341 Dysthymic disorder: Secondary | ICD-10-CM | POA: Diagnosis not present

## 2017-08-30 DIAGNOSIS — E669 Obesity, unspecified: Secondary | ICD-10-CM | POA: Diagnosis not present

## 2017-08-30 DIAGNOSIS — J208 Acute bronchitis due to other specified organisms: Secondary | ICD-10-CM | POA: Diagnosis not present

## 2017-08-30 DIAGNOSIS — J309 Allergic rhinitis, unspecified: Secondary | ICD-10-CM | POA: Diagnosis not present

## 2017-08-30 DIAGNOSIS — D464 Refractory anemia, unspecified: Secondary | ICD-10-CM | POA: Diagnosis not present

## 2017-08-30 DIAGNOSIS — J310 Chronic rhinitis: Secondary | ICD-10-CM | POA: Diagnosis not present

## 2017-08-30 DIAGNOSIS — R030 Elevated blood-pressure reading, without diagnosis of hypertension: Secondary | ICD-10-CM | POA: Diagnosis not present

## 2017-08-30 DIAGNOSIS — E039 Hypothyroidism, unspecified: Secondary | ICD-10-CM | POA: Diagnosis not present

## 2017-08-30 DIAGNOSIS — M159 Polyosteoarthritis, unspecified: Secondary | ICD-10-CM | POA: Diagnosis not present

## 2017-08-30 DIAGNOSIS — K219 Gastro-esophageal reflux disease without esophagitis: Secondary | ICD-10-CM | POA: Diagnosis not present

## 2017-10-29 DIAGNOSIS — E669 Obesity, unspecified: Secondary | ICD-10-CM | POA: Diagnosis not present

## 2017-10-29 DIAGNOSIS — Z1231 Encounter for screening mammogram for malignant neoplasm of breast: Secondary | ICD-10-CM | POA: Diagnosis not present

## 2017-10-29 DIAGNOSIS — Z6833 Body mass index (BMI) 33.0-33.9, adult: Secondary | ICD-10-CM | POA: Diagnosis not present

## 2017-10-29 DIAGNOSIS — Z0000 Encounter for general adult medical examination without abnormal findings: Secondary | ICD-10-CM | POA: Diagnosis not present

## 2017-10-29 DIAGNOSIS — Z9181 History of falling: Secondary | ICD-10-CM | POA: Diagnosis not present

## 2017-10-29 DIAGNOSIS — E785 Hyperlipidemia, unspecified: Secondary | ICD-10-CM | POA: Diagnosis not present

## 2017-10-29 DIAGNOSIS — N959 Unspecified menopausal and perimenopausal disorder: Secondary | ICD-10-CM | POA: Diagnosis not present

## 2017-11-02 DIAGNOSIS — R030 Elevated blood-pressure reading, without diagnosis of hypertension: Secondary | ICD-10-CM | POA: Diagnosis not present

## 2017-11-02 DIAGNOSIS — D464 Refractory anemia, unspecified: Secondary | ICD-10-CM | POA: Diagnosis not present

## 2017-11-02 DIAGNOSIS — E039 Hypothyroidism, unspecified: Secondary | ICD-10-CM | POA: Diagnosis not present

## 2017-11-02 DIAGNOSIS — M810 Age-related osteoporosis without current pathological fracture: Secondary | ICD-10-CM | POA: Diagnosis not present

## 2017-11-02 DIAGNOSIS — E7800 Pure hypercholesterolemia, unspecified: Secondary | ICD-10-CM | POA: Diagnosis not present

## 2017-11-02 DIAGNOSIS — K219 Gastro-esophageal reflux disease without esophagitis: Secondary | ICD-10-CM | POA: Diagnosis not present

## 2017-11-02 DIAGNOSIS — F341 Dysthymic disorder: Secondary | ICD-10-CM | POA: Diagnosis not present

## 2017-11-02 DIAGNOSIS — M159 Polyosteoarthritis, unspecified: Secondary | ICD-10-CM | POA: Diagnosis not present

## 2017-11-02 DIAGNOSIS — J309 Allergic rhinitis, unspecified: Secondary | ICD-10-CM | POA: Diagnosis not present

## 2017-11-02 DIAGNOSIS — J310 Chronic rhinitis: Secondary | ICD-10-CM | POA: Diagnosis not present

## 2017-11-11 DIAGNOSIS — E2839 Other primary ovarian failure: Secondary | ICD-10-CM | POA: Diagnosis not present

## 2017-11-11 DIAGNOSIS — M8580 Other specified disorders of bone density and structure, unspecified site: Secondary | ICD-10-CM | POA: Diagnosis not present

## 2017-11-20 DIAGNOSIS — R270 Ataxia, unspecified: Secondary | ICD-10-CM | POA: Diagnosis not present

## 2017-11-20 DIAGNOSIS — R509 Fever, unspecified: Secondary | ICD-10-CM | POA: Diagnosis not present

## 2017-11-20 DIAGNOSIS — K219 Gastro-esophageal reflux disease without esophagitis: Secondary | ICD-10-CM | POA: Diagnosis not present

## 2017-11-20 DIAGNOSIS — J310 Chronic rhinitis: Secondary | ICD-10-CM | POA: Diagnosis not present

## 2017-11-20 DIAGNOSIS — M159 Polyosteoarthritis, unspecified: Secondary | ICD-10-CM | POA: Diagnosis not present

## 2017-11-20 DIAGNOSIS — R51 Headache: Secondary | ICD-10-CM | POA: Diagnosis not present

## 2017-11-20 DIAGNOSIS — E871 Hypo-osmolality and hyponatremia: Secondary | ICD-10-CM | POA: Diagnosis not present

## 2017-11-20 DIAGNOSIS — F341 Dysthymic disorder: Secondary | ICD-10-CM | POA: Diagnosis not present

## 2017-11-20 DIAGNOSIS — R112 Nausea with vomiting, unspecified: Secondary | ICD-10-CM | POA: Diagnosis not present

## 2017-11-20 DIAGNOSIS — J309 Allergic rhinitis, unspecified: Secondary | ICD-10-CM | POA: Diagnosis not present

## 2017-11-20 DIAGNOSIS — R030 Elevated blood-pressure reading, without diagnosis of hypertension: Secondary | ICD-10-CM | POA: Diagnosis not present

## 2017-11-20 DIAGNOSIS — R5382 Chronic fatigue, unspecified: Secondary | ICD-10-CM | POA: Diagnosis not present

## 2017-11-20 DIAGNOSIS — D464 Refractory anemia, unspecified: Secondary | ICD-10-CM | POA: Diagnosis not present

## 2017-11-20 DIAGNOSIS — E039 Hypothyroidism, unspecified: Secondary | ICD-10-CM | POA: Diagnosis not present

## 2017-11-20 DIAGNOSIS — M810 Age-related osteoporosis without current pathological fracture: Secondary | ICD-10-CM | POA: Diagnosis not present

## 2017-11-20 DIAGNOSIS — R42 Dizziness and giddiness: Secondary | ICD-10-CM | POA: Diagnosis not present

## 2017-11-20 DIAGNOSIS — E7800 Pure hypercholesterolemia, unspecified: Secondary | ICD-10-CM | POA: Diagnosis not present

## 2017-11-20 DIAGNOSIS — J0100 Acute maxillary sinusitis, unspecified: Secondary | ICD-10-CM | POA: Diagnosis not present

## 2017-11-21 DIAGNOSIS — E878 Other disorders of electrolyte and fluid balance, not elsewhere classified: Secondary | ICD-10-CM | POA: Diagnosis present

## 2017-11-21 DIAGNOSIS — I10 Essential (primary) hypertension: Secondary | ICD-10-CM | POA: Diagnosis present

## 2017-11-21 DIAGNOSIS — R270 Ataxia, unspecified: Secondary | ICD-10-CM | POA: Diagnosis not present

## 2017-11-21 DIAGNOSIS — R112 Nausea with vomiting, unspecified: Secondary | ICD-10-CM | POA: Diagnosis not present

## 2017-11-21 DIAGNOSIS — R51 Headache: Secondary | ICD-10-CM | POA: Diagnosis not present

## 2017-11-21 DIAGNOSIS — R69 Illness, unspecified: Secondary | ICD-10-CM | POA: Diagnosis not present

## 2017-11-21 DIAGNOSIS — Z7982 Long term (current) use of aspirin: Secondary | ICD-10-CM | POA: Diagnosis not present

## 2017-11-21 DIAGNOSIS — J111 Influenza due to unidentified influenza virus with other respiratory manifestations: Secondary | ICD-10-CM | POA: Diagnosis not present

## 2017-11-21 DIAGNOSIS — R739 Hyperglycemia, unspecified: Secondary | ICD-10-CM | POA: Diagnosis present

## 2017-11-21 DIAGNOSIS — E039 Hypothyroidism, unspecified: Secondary | ICD-10-CM | POA: Diagnosis not present

## 2017-11-21 DIAGNOSIS — E860 Dehydration: Secondary | ICD-10-CM | POA: Diagnosis not present

## 2017-11-21 DIAGNOSIS — F329 Major depressive disorder, single episode, unspecified: Secondary | ICD-10-CM | POA: Diagnosis present

## 2017-11-21 DIAGNOSIS — Z79899 Other long term (current) drug therapy: Secondary | ICD-10-CM | POA: Diagnosis not present

## 2017-11-21 DIAGNOSIS — E876 Hypokalemia: Secondary | ICD-10-CM | POA: Diagnosis not present

## 2017-11-21 DIAGNOSIS — E871 Hypo-osmolality and hyponatremia: Secondary | ICD-10-CM | POA: Diagnosis not present

## 2017-11-21 DIAGNOSIS — R42 Dizziness and giddiness: Secondary | ICD-10-CM | POA: Diagnosis not present

## 2017-11-26 ENCOUNTER — Ambulatory Visit: Payer: Medicare Other | Admitting: Allergy

## 2017-11-27 DIAGNOSIS — J310 Chronic rhinitis: Secondary | ICD-10-CM | POA: Diagnosis not present

## 2017-11-27 DIAGNOSIS — M810 Age-related osteoporosis without current pathological fracture: Secondary | ICD-10-CM | POA: Diagnosis not present

## 2017-11-27 DIAGNOSIS — M159 Polyosteoarthritis, unspecified: Secondary | ICD-10-CM | POA: Diagnosis not present

## 2017-11-27 DIAGNOSIS — K219 Gastro-esophageal reflux disease without esophagitis: Secondary | ICD-10-CM | POA: Diagnosis not present

## 2017-11-27 DIAGNOSIS — E039 Hypothyroidism, unspecified: Secondary | ICD-10-CM | POA: Diagnosis not present

## 2017-11-27 DIAGNOSIS — J309 Allergic rhinitis, unspecified: Secondary | ICD-10-CM | POA: Diagnosis not present

## 2017-11-27 DIAGNOSIS — D72829 Elevated white blood cell count, unspecified: Secondary | ICD-10-CM | POA: Diagnosis not present

## 2017-11-27 DIAGNOSIS — D464 Refractory anemia, unspecified: Secondary | ICD-10-CM | POA: Diagnosis not present

## 2017-11-27 DIAGNOSIS — F341 Dysthymic disorder: Secondary | ICD-10-CM | POA: Diagnosis not present

## 2017-11-27 DIAGNOSIS — E871 Hypo-osmolality and hyponatremia: Secondary | ICD-10-CM | POA: Diagnosis not present

## 2017-11-27 DIAGNOSIS — E7800 Pure hypercholesterolemia, unspecified: Secondary | ICD-10-CM | POA: Diagnosis not present

## 2017-11-27 DIAGNOSIS — R030 Elevated blood-pressure reading, without diagnosis of hypertension: Secondary | ICD-10-CM | POA: Diagnosis not present

## 2017-12-06 DIAGNOSIS — E7800 Pure hypercholesterolemia, unspecified: Secondary | ICD-10-CM | POA: Diagnosis not present

## 2017-12-06 DIAGNOSIS — M159 Polyosteoarthritis, unspecified: Secondary | ICD-10-CM | POA: Diagnosis not present

## 2017-12-06 DIAGNOSIS — D72829 Elevated white blood cell count, unspecified: Secondary | ICD-10-CM | POA: Diagnosis not present

## 2017-12-06 DIAGNOSIS — E871 Hypo-osmolality and hyponatremia: Secondary | ICD-10-CM | POA: Diagnosis not present

## 2017-12-06 DIAGNOSIS — J309 Allergic rhinitis, unspecified: Secondary | ICD-10-CM | POA: Diagnosis not present

## 2017-12-06 DIAGNOSIS — J310 Chronic rhinitis: Secondary | ICD-10-CM | POA: Diagnosis not present

## 2017-12-06 DIAGNOSIS — F341 Dysthymic disorder: Secondary | ICD-10-CM | POA: Diagnosis not present

## 2017-12-06 DIAGNOSIS — K219 Gastro-esophageal reflux disease without esophagitis: Secondary | ICD-10-CM | POA: Diagnosis not present

## 2017-12-06 DIAGNOSIS — M810 Age-related osteoporosis without current pathological fracture: Secondary | ICD-10-CM | POA: Diagnosis not present

## 2017-12-06 DIAGNOSIS — E039 Hypothyroidism, unspecified: Secondary | ICD-10-CM | POA: Diagnosis not present

## 2017-12-06 DIAGNOSIS — I10 Essential (primary) hypertension: Secondary | ICD-10-CM | POA: Diagnosis not present

## 2017-12-06 DIAGNOSIS — D464 Refractory anemia, unspecified: Secondary | ICD-10-CM | POA: Diagnosis not present

## 2017-12-25 DIAGNOSIS — E039 Hypothyroidism, unspecified: Secondary | ICD-10-CM | POA: Diagnosis not present

## 2017-12-25 DIAGNOSIS — D72829 Elevated white blood cell count, unspecified: Secondary | ICD-10-CM | POA: Diagnosis not present

## 2017-12-25 DIAGNOSIS — K219 Gastro-esophageal reflux disease without esophagitis: Secondary | ICD-10-CM | POA: Diagnosis not present

## 2017-12-25 DIAGNOSIS — E871 Hypo-osmolality and hyponatremia: Secondary | ICD-10-CM | POA: Diagnosis not present

## 2017-12-25 DIAGNOSIS — F329 Major depressive disorder, single episode, unspecified: Secondary | ICD-10-CM | POA: Diagnosis not present

## 2017-12-25 DIAGNOSIS — J309 Allergic rhinitis, unspecified: Secondary | ICD-10-CM | POA: Diagnosis not present

## 2017-12-25 DIAGNOSIS — E7800 Pure hypercholesterolemia, unspecified: Secondary | ICD-10-CM | POA: Diagnosis not present

## 2017-12-25 DIAGNOSIS — M159 Polyosteoarthritis, unspecified: Secondary | ICD-10-CM | POA: Diagnosis not present

## 2017-12-25 DIAGNOSIS — D644 Congenital dyserythropoietic anemia: Secondary | ICD-10-CM | POA: Diagnosis not present

## 2018-01-28 DIAGNOSIS — I10 Essential (primary) hypertension: Secondary | ICD-10-CM | POA: Diagnosis not present

## 2018-01-28 DIAGNOSIS — E039 Hypothyroidism, unspecified: Secondary | ICD-10-CM | POA: Diagnosis not present

## 2018-01-28 DIAGNOSIS — E7800 Pure hypercholesterolemia, unspecified: Secondary | ICD-10-CM | POA: Diagnosis not present

## 2018-01-28 DIAGNOSIS — M159 Polyosteoarthritis, unspecified: Secondary | ICD-10-CM | POA: Diagnosis not present

## 2018-01-28 DIAGNOSIS — J309 Allergic rhinitis, unspecified: Secondary | ICD-10-CM | POA: Diagnosis not present

## 2018-01-28 DIAGNOSIS — D464 Refractory anemia, unspecified: Secondary | ICD-10-CM | POA: Diagnosis not present

## 2018-02-04 ENCOUNTER — Encounter: Payer: Self-pay | Admitting: Allergy

## 2018-02-04 ENCOUNTER — Encounter

## 2018-02-04 ENCOUNTER — Ambulatory Visit (INDEPENDENT_AMBULATORY_CARE_PROVIDER_SITE_OTHER): Payer: Medicare Other | Admitting: Allergy

## 2018-02-04 VITALS — BP 138/86 | HR 100 | Temp 98.3°F | Resp 20 | Ht 62.3 in | Wt 187.0 lb

## 2018-02-04 DIAGNOSIS — J309 Allergic rhinitis, unspecified: Secondary | ICD-10-CM | POA: Diagnosis not present

## 2018-02-04 DIAGNOSIS — J4540 Moderate persistent asthma, uncomplicated: Secondary | ICD-10-CM | POA: Diagnosis not present

## 2018-02-04 DIAGNOSIS — H1010 Acute atopic conjunctivitis, unspecified eye: Secondary | ICD-10-CM

## 2018-02-04 MED ORDER — LEVOCETIRIZINE DIHYDROCHLORIDE 5 MG PO TABS: ORAL_TABLET | ORAL | 5 refills | Status: DC

## 2018-02-04 MED ORDER — LEVALBUTEROL TARTRATE 45 MCG/ACT IN AERO
INHALATION_SPRAY | RESPIRATORY_TRACT | 1 refills | Status: DC
Start: 2018-02-04 — End: 2018-12-05

## 2018-02-04 MED ORDER — AZELASTINE HCL 0.1 % NA SOLN: NASAL | 5 refills | Status: DC

## 2018-02-04 NOTE — Patient Instructions (Addendum)
Environmental allergy   - environmental allergy skin testing is positive to mold.     - trial Xyzal 5mg  daily (this replaces benadryl you are taking multiple times a day)   - for nasal congestion use nasal steroid Rhinocort or Nasacort 2 sprays each nostril daily.  Use for 1-2 weeks at a time before stopping once symptoms improve    - for nasal drainage/sore throat/throat clearing use nasal antihistamine, Astelin, 2 sprays each nostril twice a day.    - use nasal sprays with proper nasal spray technique    - allergen avoidance measures provided    - if needed will add in Singulair to your current regimen  Asthma, allergic   - lung function testing is normal   - have access to Xopenex inhaler 2 puffs every 4-6 hours as needed for cough/wheeze/shortness of breath/chest tightness.  May use 15-20 minutes prior to activity.   Monitor frequency of use.    Control goals:   Full participation in all desired activities (may need albuterol before activity)  Albuterol use two time or less a week on average (not counting use with activity)  Cough interfering with sleep two time or less a month  Oral steroids no more than once a year  No hospitalizations  Follow-up 3-4 months or sooner if needed

## 2018-02-04 NOTE — Progress Notes (Signed)
New Patient Note  RE: Brandy Torres MRN: [id number] DOB: Sep 14, 1945 Date of Office Visit: 02/04/2018  Referring provider: Cher Nakai, MD Primary care provider: Cher Nakai, MD  Chief Complaint: allergies  History of present illness: Brandy Torres is a 72 y.o. female presenting today for consultation for allergic rhinitis.   She states she has been treated by PCP with OTC allergy medications and steroid injections for her allergy symptoms.  She states she has had allergy symptoms for years since she was a child.  She has nasal congestion, nasal drainage, HA, sore throat, sneezing, itchy eyes, ear fullness, cough and wheeze.  She states during the day she keeps a hacking/tickling cough.  Symptoms worse in spring and fall but can have symptoms year round.  She had been taking zyrtec-D but stopped this as she was having high blood pressure.  Thus she changed to a benadryl allergy and sinus medication which she states she takes about 3 times a day.  She uses flonase that she does not use regularly (states if she wakes up or at night when congested will use it).  She states she is getting sinus infections about 3-4 times a year that sometimes involve toothpain and treated with antibiotics usually requiring a second antibiotic round.  She states she has never used a nebulizer but has used an albuterol inhaler about 15 years ago when she had bronchitis.   She has seen Dr. Gaylyn Cheers with ENT and did have a "sinus scan" in 06/2017 and per pt states she has a deviated septum but no sinus disease.  She did have allergy testing and did go on immunotherapy in the '80s but had to stop as she states at the time she had an undiagnosed tachycardia that immunotherapy was worsening and she was also getting large local swellings thus she decided to stop.  No history of eczema or food allergies.   Review of systems: Review of Systems  Constitutional: Negative for chills, fever and malaise/fatigue.  HENT: Positive for  congestion and sore throat. Negative for ear discharge, ear pain and nosebleeds.   Eyes: Positive for redness. Negative for pain and discharge.  Respiratory: Negative for cough, shortness of breath and wheezing.   Cardiovascular: Negative for chest pain.  Gastrointestinal: Negative for abdominal pain, constipation, diarrhea, heartburn, nausea and vomiting.  Musculoskeletal: Positive for joint pain.  Skin: Positive for itching. Negative for rash.  Neurological: Positive for headaches. Negative for dizziness.    All other systems negative unless noted above in HPI  Past medical history: Past Medical History:  Diagnosis Date  . Acid reflux   . AV nodal re-entry tachycardia [facility])    Cardiac Ablation  . Duodenal ulcer   . Fibromyalgia   . High blood pressure   . High cholesterol   . Hypothyroidism   . Osteoarthritis   . Osteoporosis     Past surgical history: Past Surgical History:  Procedure Laterality Date  . ABLATION  2000   Cardiac ablation  . ADENOIDECTOMY    . BLADDER SURGERY  2002   Bladder Tack and Sling  . CATARACT EXTRACTION  2016  . KNEE ARTHROSCOPY Right 2011  . THYROIDECTOMY  1984  . TONSILLECTOMY    . TUBAL LIGATION  1982    Family history:  Family History  Problem Relation Age of Onset  . COPD Father   . Coronary artery disease Father   . Rheum arthritis Sister   . Breast cancer Maternal Aunt   .  Breast cancer Maternal Grandmother   . Breast cancer Maternal Aunt   . Allergic rhinitis Son   . Dystonia Son   . Rheum arthritis Son   . Diabetes Sister     Social history: Lives in a home with carpeting with electric heating and central cooling. No pets in the home.  No concern for water damage, mildew or roaches in the home.  She is retired.    Medication List: Allergies as of 02/04/2018      Reactions   Avelox [moxifloxacin Hcl In Nacl] Swelling   Throat swelling.   Benzoxonium Chloride [benzoxonium]    Eyes red, pain, itching   Celebrex  [celecoxib] Other (See Comments)   Throat, mouth thickness   Penicillins Other (See Comments)   Itching at injection site      Medication List        Accurate as of 02/04/18  5:34 PM. Always use your most recent med list.          alendronate 70 MG tablet Commonly known as:  FOSAMAX Take 70 mg by mouth once a week.   amLODipine 5 MG tablet Commonly known as:  NORVASC Take 5 mg by mouth daily.   aspirin EC 81 MG tablet Take 81 mg by mouth daily.   azelastine 0.1 % nasal spray Commonly known as:  ASTELIN Use two sprays in each nostril twice daily as directed   BENADRYL ALLERGY PO Take by mouth as needed.   BUPROPION HCL ER (XL) PO Take 300 mg by mouth daily.   CALCIUM 600 PO Take by mouth 2 (two) times daily.   escitalopram 20 MG tablet Commonly known as:  LEXAPRO Take 20 mg by mouth daily.   esomeprazole 20 MG capsule Commonly known as:  NEXIUM Take 20 mg by mouth daily at 12 noon.   FIBER PO Take by mouth as needed.   FLONASE 50 MCG/ACT nasal spray Generic drug:  fluticasone Place 1 spray into both nostrils daily.   GLUCOSAMINE PO Take by mouth 2 (two) times daily.   levalbuterol 45 MCG/ACT inhaler Commonly known as:  XOPENEX HFA Inhale two puffs every 4-6 hours if needed for cough or wheeze   levocetirizine 5 MG tablet Commonly known as:  XYZAL Take one tablet once daily   levothyroxine 175 MCG tablet Commonly known as:  SYNTHROID, LEVOTHROID Take 175 mcg by mouth daily.   MSM 1500 MG Tabs Take by mouth 2 (two) times daily.   MULTIVITAMIN ADULT PO Take by mouth daily.   rosuvastatin 20 MG tablet Commonly known as:  CRESTOR Take 20 mg by mouth daily.   TYLENOL PO Take 650 mg by mouth as needed.   VEGETABLE LAXATIVE PO Take by mouth as needed.   VITAMIN D3 PO Take 800 mg by mouth 2 (two) times daily.       Known medication allergies: Allergies  Allergen Reactions  . Avelox [Moxifloxacin Hcl In Nacl] Swelling    Throat  swelling.  . Benzoxonium Chloride [Benzoxonium]     Eyes red, pain, itching  . Celebrex [Celecoxib] Other (See Comments)    Throat, mouth thickness  . Penicillins Other (See Comments)    Itching at injection site     Physical examination: Blood pressure 138/86, pulse 100, temperature 98.3 F (36.8 C), temperature source Oral, resp. rate 20, height 5' 2.3" (1.582 m), weight 187 lb (84.8 kg).  General: Alert, interactive, in no acute distress. HEENT: PERRLA, TMs pearly gray, turbinates moderately edematous without discharge,  post-pharynx non erythematous. Neck: Supple without lymphadenopathy. Lungs: Mildly decreased breath sounds with expiratory wheezing of upper lef lung field. {no increased work of breathing. Post-BD wheezing resolved CV: Normal S1, S2 without murmurs. Abdomen: Nondistended, nontender. Skin: Warm and dry, without lesions or rashes. Extremities:  No clubbing, cyanosis or edema. Neuro:   Grossly intact.  Diagnositics/Labs:  Spirometry: FEV1: 1.65L  84%, FVC: 2.31L  88%.  Post-BD she has a 7% increase in FEV1  Allergy testing: environmental allergy skin prick testing is positive to aspergillus.  Intradermal testing is positive to mold mix 4 Allergy testing results were read and interpreted by provider, documented by clinical staff.   Assessment and plan:   Allergic rhinoconjunctivitis   - environmental allergy skin testing is positive to mold.     - trial Xyzal 5mg  daily (this replaces benadryl you are taking multiple times a day)   - for nasal congestion use nasal steroid Rhinocort or Nasacort 2 sprays each nostril daily.  Use for 1-2 weeks at a time before stopping once symptoms improve    - for nasal drainage/sore throat/throat clearing use nasal antihistamine, Astelin, 2 sprays each nostril twice a day.    - use nasal sprays with proper nasal spray technique    - allergen avoidance measures provided    - if needed will add in Singulair to your current  regimen  Asthma, allergic   - lung function testing is normal   - have access to Xopenex inhaler 2 puffs every 4-6 hours as needed for cough/wheeze/shortness of breath/chest tightness.  May use 15-20 minutes prior to activity.   Monitor frequency of use.    Control goals:   Full participation in all desired activities (may need albuterol before activity)  Albuterol use two time or less a week on average (not counting use with activity)  Cough interfering with sleep two time or less a month  Oral steroids no more than once a year  No hospitalizations  Follow-up 3-4 months or sooner if needed  I appreciate the opportunity to take part in [facility]'s care. Please do not hesitate to contact me with questions.  Sincerely,   Prudy Feeler, MD Allergy/Immunology [facility] [place]

## 2018-05-01 DIAGNOSIS — E7800 Pure hypercholesterolemia, unspecified: Secondary | ICD-10-CM | POA: Diagnosis not present

## 2018-05-01 DIAGNOSIS — M159 Polyosteoarthritis, unspecified: Secondary | ICD-10-CM | POA: Diagnosis not present

## 2018-05-01 DIAGNOSIS — I10 Essential (primary) hypertension: Secondary | ICD-10-CM | POA: Diagnosis not present

## 2018-05-01 DIAGNOSIS — D464 Refractory anemia, unspecified: Secondary | ICD-10-CM | POA: Diagnosis not present

## 2018-05-01 DIAGNOSIS — J310 Chronic rhinitis: Secondary | ICD-10-CM | POA: Diagnosis not present

## 2018-05-01 DIAGNOSIS — K219 Gastro-esophageal reflux disease without esophagitis: Secondary | ICD-10-CM | POA: Diagnosis not present

## 2018-05-01 DIAGNOSIS — J309 Allergic rhinitis, unspecified: Secondary | ICD-10-CM | POA: Diagnosis not present

## 2018-05-06 ENCOUNTER — Encounter: Payer: Self-pay | Admitting: Allergy

## 2018-05-06 ENCOUNTER — Ambulatory Visit (INDEPENDENT_AMBULATORY_CARE_PROVIDER_SITE_OTHER): Payer: Medicare Other | Admitting: Allergy

## 2018-05-06 VITALS — BP 142/78 | HR 80 | Resp 18

## 2018-05-06 DIAGNOSIS — J309 Allergic rhinitis, unspecified: Secondary | ICD-10-CM

## 2018-05-06 DIAGNOSIS — H1010 Acute atopic conjunctivitis, unspecified eye: Secondary | ICD-10-CM

## 2018-05-06 DIAGNOSIS — J4540 Moderate persistent asthma, uncomplicated: Secondary | ICD-10-CM | POA: Diagnosis not present

## 2018-05-06 MED ORDER — MONTELUKAST SODIUM 10 MG PO TABS: 10.0000 mg | ORAL_TABLET | Freq: Every day | ORAL | 5 refills | Status: DC

## 2018-05-06 MED ORDER — BUDESONIDE-FORMOTEROL FUMARATE 160-4.5 MCG/ACT IN AERO: INHALATION_SPRAY | RESPIRATORY_TRACT | 5 refills | Status: DC

## 2018-05-06 NOTE — Progress Notes (Signed)
Follow-up Note  RE: Brandy Torres MRN: [id number] DOB: [birth year] Date of Office Visit: 05/06/2018   History of present illness: Brandy Torres is a 72 y.o. female presenting today for follow-up of asthma and allergic rhinoconjunctivitis.  She was last seen on Feb 04, 2018 by myself.  Since this visit she states she has been doing a bit better.  She does feel that the azelastine nasal spray does help control nasal itch and drainage.  However does not feel the nasacort helps with congestion which is worse at night.  She also states the xyzal was not effective for her.  She also reports having frontal headache which she states she has been having for years and uses tylenol which does not always help. She likes to garden and states she did garden this past weekend and feels that exposure likely has led to increased nasal congestion and drainage this week.  With her asthma symptoms she states she has been needing to use her levalbuterol most mornings for cough with relief of symptoms.  She denies any nighttime awakenings.  She has not required any ED/UC visits or oral steroids since last visit.    Review of systems: Review of Systems  Constitutional: Negative for chills, fever and malaise/fatigue.  HENT: Positive for congestion. Negative for ear discharge, ear pain, nosebleeds, sinus pain and sore throat.   Eyes: Negative for pain, discharge and redness.  Respiratory: Positive for cough and shortness of breath. Negative for sputum production and wheezing.   Cardiovascular: Negative for chest pain.  Gastrointestinal: Negative for abdominal pain, constipation, diarrhea, heartburn, nausea and vomiting.  Musculoskeletal: Negative for joint pain.  Skin: Negative for itching and rash.  Neurological: Positive for headaches. Negative for dizziness.    All other systems negative unless noted above in HPI  Past medical/social/surgical/family history have been reviewed and are unchanged unless  specifically indicated below.  No changes  Medication List: Allergies as of 05/06/2018      Reactions   Avelox [moxifloxacin Hcl In Nacl] Swelling   Throat swelling.   Benzoxonium Chloride [benzoxonium]    Eyes red, pain, itching   Celebrex [celecoxib] Other (See Comments)   Throat, mouth thickness   Penicillins Other (See Comments)   Itching at injection site      Medication List        Accurate as of 05/06/18 12:18 PM. Always use your most recent med list.          alendronate 70 MG tablet Commonly known as:  FOSAMAX Take 70 mg by mouth once a week.   amLODipine 5 MG tablet Commonly known as:  NORVASC Take 5 mg by mouth daily.   aspirin EC 81 MG tablet Take 81 mg by mouth daily.   azelastine 0.1 % nasal spray Commonly known as:  ASTELIN Use two sprays in each nostril twice daily as directed   budesonide-formoterol 160-4.5 MCG/ACT inhaler Commonly known as:  SYMBICORT Inhale two puffs twice daily to prevent cough or wheeze.  Rinse, gargle, and spit after use.   buPROPion 300 MG 24 hr tablet Commonly known as:  WELLBUTRIN XL Take 300 mg by mouth daily.   CALCIUM 600 PO Take by mouth 2 (two) times daily.   escitalopram 20 MG tablet Commonly known as:  LEXAPRO Take 20 mg by mouth daily.   esomeprazole 20 MG capsule Commonly known as:  NEXIUM Take 20 mg by mouth daily at 12 noon.   FIBER PO Take by mouth as  needed.   GLUCOSAMINE PO Take by mouth 2 (two) times daily.   levalbuterol 45 MCG/ACT inhaler Commonly known as:  XOPENEX HFA Inhale two puffs every 4-6 hours if needed for cough or wheeze   levocetirizine 5 MG tablet Commonly known as:  XYZAL Take one tablet once daily   levothyroxine 175 MCG tablet Commonly known as:  SYNTHROID, LEVOTHROID Take 175 mcg by mouth daily.   montelukast 10 MG tablet Commonly known as:  SINGULAIR Take 1 tablet (10 mg total) by mouth at bedtime.   MSM 1500 MG Tabs Take by mouth 2 (two) times daily.     MULTIVITAMIN ADULT PO Take by mouth daily.   rosuvastatin 20 MG tablet Commonly known as:  CRESTOR Take 20 mg by mouth daily.   TYLENOL PO Take 650 mg by mouth as needed.   VEGETABLE LAXATIVE PO Take by mouth as needed.   VITAMIN D3 PO Take 800 mg by mouth 2 (two) times daily.       Known medication allergies: Allergies  Allergen Reactions  . Avelox [Moxifloxacin Hcl In Nacl] Swelling    Throat swelling.  . Benzoxonium Chloride [Benzoxonium]     Eyes red, pain, itching  . Celebrex [Celecoxib] Other (See Comments)    Throat, mouth thickness  . Penicillins Other (See Comments)    Itching at injection site     Physical examination: Blood pressure (!) 142/78, pulse 80, resp. rate 18.  General: Alert, interactive, in no acute distress. HEENT: PERRLA, TMs pearly gray, turbinates moderately edematous with clear discharge, post-pharynx non erythematous. Neck: Supple without lymphadenopathy. Lungs: Clear to auscultation without wheezing, rhonchi or rales. {no increased work of breathing. CV: Normal S1, S2 without murmurs. Abdomen: Nondistended, nontender. Skin: Warm and dry, without lesions or rashes. Extremities:  No clubbing, cyanosis or edema. Neuro:   Grossly intact.  Diagnositics/Labs:  Spirometry: FEV1: 1.54L 78%, FVC: 2.18L 83%, ratio consistent with nonobstructive pattern  Assessment and plan:   Environmental allergy   - continue avoidance measures for molds.     - for nasal congestion/sinus congestion will have you trial Xhance device which is flonase - use 2 sprays 1-2 times a day.  This device allows for better deposition of the medication into your sinuses.  Month sample provided.  Let us know if this is effective for you    - for nasal drainage/sore throat/throat clearing continue use nasal antihistamine, Astelin, 2 sprays each nostril twice a day.    - if Truett Perna is not effective then will prescribe nasal atrovent    - will start Montelukast 10mg  daily -  take at bedtime  Asthma, allergic   - lung function testing is normal   - have access to Xopenex inhaler 2 puffs every 4-6 hours as needed for cough/wheeze/shortness of breath/chest tightness.  May use 15-20 minutes prior to activity.   Monitor frequency of use.      - start Symbicort 12mcg 2 puffs twice a day at this time.  This should help reduce symptoms and need for as needed Xopenex (levalbuterol) use.  Control goals:   Full participation in all desired activities (may need albuterol before activity)  Albuterol use two time or less a week on average (not counting use with activity)  Cough interfering with sleep two time or less a month  Oral steroids no more than once a year  No hospitalizations  Follow-up 3 months or sooner if needed   I appreciate the opportunity to take part in [facility]'s care.  Please do not hesitate to contact me with questions.  Sincerely,   Prudy Feeler, MD Allergy/Immunology [facility] [place]

## 2018-05-06 NOTE — Patient Instructions (Signed)
Environmental allergy   - continue avoidance measures for molds.     - for nasal congestion/sinus congestion will have you trial Xhance device which is flonase - use 2 sprays 1-2 times a day.  This device allows for better deposition of the medication into your sinuses.  Month sample provided.  Let us know if this is effective for you    - for nasal drainage/sore throat/throat clearing continue use nasal antihistamine, Astelin, 2 sprays each nostril twice a day.    - if Truett Perna is not effective then will prescribe nasal atrovent    - will start Montelukast 10mg  daily - take at bedtime  Asthma, allergic   - lung function testing is normal   - have access to Xopenex inhaler 2 puffs every 4-6 hours as needed for cough/wheeze/shortness of breath/chest tightness.  May use 15-20 minutes prior to activity.   Monitor frequency of use.      - start Symbicort 159mcg 2 puffs twice a day at this time.  This should help reduce symptoms and need for as needed Xopenex (levalbuterol) use.  Control goals:   Full participation in all desired activities (may need albuterol before activity)  Albuterol use two time or less a week on average (not counting use with activity)  Cough interfering with sleep two time or less a month  Oral steroids no more than once a year  No hospitalizations  Follow-up 3 months or sooner if needed

## 2018-05-20 ENCOUNTER — Telehealth: Payer: Self-pay

## 2018-05-20 NOTE — Telephone Encounter (Signed)
Patient called to inform us that the Truett Perna has been helpful.  Since patient has Medicare, Truett Perna should not be covered. Per Dr. Nelva Bush we can provide patient with samples of Truett Perna so she can continue with the medication.  Patient will let us know when she needs another sample.

## 2018-05-22 DIAGNOSIS — Z1231 Encounter for screening mammogram for malignant neoplasm of breast: Secondary | ICD-10-CM | POA: Diagnosis not present

## 2018-07-28 DIAGNOSIS — D464 Refractory anemia, unspecified: Secondary | ICD-10-CM | POA: Diagnosis not present

## 2018-07-28 DIAGNOSIS — I10 Essential (primary) hypertension: Secondary | ICD-10-CM | POA: Diagnosis not present

## 2018-07-28 DIAGNOSIS — E039 Hypothyroidism, unspecified: Secondary | ICD-10-CM | POA: Diagnosis not present

## 2018-07-28 DIAGNOSIS — J028 Acute pharyngitis due to other specified organisms: Secondary | ICD-10-CM | POA: Diagnosis not present

## 2018-07-28 DIAGNOSIS — E7800 Pure hypercholesterolemia, unspecified: Secondary | ICD-10-CM | POA: Diagnosis not present

## 2018-07-28 DIAGNOSIS — J310 Chronic rhinitis: Secondary | ICD-10-CM | POA: Diagnosis not present

## 2018-07-28 DIAGNOSIS — M159 Polyosteoarthritis, unspecified: Secondary | ICD-10-CM | POA: Diagnosis not present

## 2018-07-28 DIAGNOSIS — J309 Allergic rhinitis, unspecified: Secondary | ICD-10-CM | POA: Diagnosis not present

## 2018-08-12 ENCOUNTER — Ambulatory Visit (INDEPENDENT_AMBULATORY_CARE_PROVIDER_SITE_OTHER): Payer: Medicare Other | Admitting: Allergy

## 2018-08-12 ENCOUNTER — Encounter: Payer: Self-pay | Admitting: Allergy

## 2018-08-12 VITALS — BP 140/80 | HR 71 | Resp 18

## 2018-08-12 DIAGNOSIS — J0100 Acute maxillary sinusitis, unspecified: Secondary | ICD-10-CM

## 2018-08-12 DIAGNOSIS — J3089 Other allergic rhinitis: Secondary | ICD-10-CM | POA: Diagnosis not present

## 2018-08-12 DIAGNOSIS — J4541 Moderate persistent asthma with (acute) exacerbation: Secondary | ICD-10-CM | POA: Diagnosis not present

## 2018-08-12 MED ORDER — DOXYCYCLINE HYCLATE 100 MG PO TABS: ORAL_TABLET | ORAL | 0 refills | Status: DC

## 2018-08-12 NOTE — Patient Instructions (Addendum)
Sinus infection with asthma flare   - completed 2 rounds of azithromycin with continued symptoms of HA, sinus pressure/pain, cough, wheeze, sore throat and hoarseness   - will expand antibiotic coverage to Doxycycline 100mg  twice a day x 7 days   - take prednisone 20mg  twice a day x 5 days   - maintain adequate hydration and rest  Environmental allergy   - continue avoidance measures for molds.     - for nasal congestion continue Xhance 2 sprays 1-2 times a day (resume use after completion of above medications).      - for nasal drainage/sore throat/throat clearing continue use nasal antihistamine, Astelin, 2 sprays each nostril twice a day.    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  May use 15-20 minutes prior to activity.   Monitor frequency of use.      - continue Symbicort 167mcg 2 puffs twice a day at this time.   Control goals:   Full participation in all desired activities (may need albuterol before activity)  Albuterol use two time or less a week on average (not counting use with activity)  Cough interfering with sleep two time or less a month  Oral steroids no more than once a year  No hospitalizations  Follow-up 3-4 months or sooner if needed

## 2018-08-12 NOTE — Progress Notes (Signed)
Follow-up Note  RE: Brandy Torres MRN: [id number] DOB: [birth year] Date of Office Visit: 08/12/2018   History of present illness: Brandy Torres is a 72 y.o. female presenting today for follow-up of allergic rhinitis and allergic asthma.  She was last seen in the office on 05/06/18 by myself.  At this visit for her allergic rhinitis symptoms I recommended she start Xhance device, Astelin and singulair.  She states all of these things helped her sinus/nasal symptoms for about 6 weeks.  I also recommend that she start use of Symbicort 146mcg 2 puffs twice a day which she states she started out doing but weaned off as she was doing well.    About 6 weeks ago she states she was helping her sister take in her outdoor plants that had a lot of pine straw and soil exposure.  After this she states her sinus symptoms returned.   She also states she had her plants in her sunroom as well that she goes out to water and does feel more symptomatic when she is in the sunroom.    She reports developing symptoms of sinus congestion, HA, sinus pressure and pain, sore throat with hoarsness, cough and chest congestion and wheezing.  She also feels she has more heavy breathing in the morning where chest feels tight.  She has required use of her xopenex 2-3 times since onset of symptoms.  She has also gone back to taking symbicort twice a day.  She states she stopped the nasal sprays and she developed nose bleeds.  She states she normally will get nose bleeds with sinus infections.  She has seen her PCP for these symptoms who prescribed a z-pak however symptoms did not improve and a 2nd round of z-pak was prescribed.  Today is last day.  She still feels a lot of sinus pain and HA and congestion as well as cough and wheezing.  Denies fevers.    She has not yet had her flu vaccine but she is interested in receiving this season.   Review of systems: Review of Systems  Constitutional: Negative for chills, fever and  malaise/fatigue.  HENT: Positive for congestion, nosebleeds, sinus pain and sore throat. Negative for ear discharge and ear pain.   Eyes: Negative for pain, discharge and redness.  Respiratory: Positive for cough, shortness of breath and wheezing. Negative for hemoptysis, sputum production and stridor.   Cardiovascular: Negative for chest pain.  Gastrointestinal: Negative for abdominal pain, constipation, diarrhea, heartburn, nausea and vomiting.  Musculoskeletal: Negative for joint pain and myalgias.  Skin: Negative for itching and rash.  Neurological: Positive for headaches. Negative for dizziness.    All other systems negative unless noted above in HPI  Past medical/social/surgical/family history have been reviewed and are unchanged unless specifically indicated below.  No changes  Medication List: Allergies as of 08/12/2018      Reactions   Avelox [moxifloxacin Hcl In Nacl] Swelling   Throat swelling.   Benzoxonium Chloride [benzoxonium]    Eyes red, pain, itching   Celebrex [celecoxib] Other (See Comments)   Throat, mouth thickness   Penicillins Other (See Comments)   Itching at injection site      Medication List        Accurate as of 08/12/18 11:06 AM. Always use your most recent med list.          alendronate 70 MG tablet Commonly known as:  FOSAMAX Take 70 mg by mouth once a week.   amLODipine  5 MG tablet Commonly known as:  NORVASC Take 5 mg by mouth daily.   aspirin EC 81 MG tablet Take 81 mg by mouth daily.   azelastine 0.1 % nasal spray Commonly known as:  ASTELIN Use two sprays in each nostril twice daily as directed   budesonide-formoterol 160-4.5 MCG/ACT inhaler Commonly known as:  SYMBICORT Inhale two puffs twice daily to prevent cough or wheeze.  Rinse, gargle, and spit after use.   buPROPion 300 MG 24 hr tablet Commonly known as:  WELLBUTRIN XL Take 300 mg by mouth daily.   CALCIUM 600 PO Take by mouth 2 (two) times daily.     escitalopram 20 MG tablet Commonly known as:  LEXAPRO Take 20 mg by mouth daily.   esomeprazole 20 MG capsule Commonly known as:  NEXIUM Take 20 mg by mouth daily at 12 noon.   FIBER PO Take by mouth as needed.   GLUCOSAMINE PO Take by mouth 2 (two) times daily.   levalbuterol 45 MCG/ACT inhaler Commonly known as:  XOPENEX HFA Inhale two puffs every 4-6 hours if needed for cough or wheeze   levocetirizine 5 MG tablet Commonly known as:  XYZAL Take one tablet once daily   levothyroxine 175 MCG tablet Commonly known as:  SYNTHROID, LEVOTHROID Take 175 mcg by mouth daily.   montelukast 10 MG tablet Commonly known as:  SINGULAIR Take 1 tablet (10 mg total) by mouth at bedtime.   MSM 1500 MG Tabs Take by mouth 2 (two) times daily.   MULTIVITAMIN ADULT PO Take by mouth daily.   rosuvastatin 20 MG tablet Commonly known as:  CRESTOR Take 20 mg by mouth daily.   TYLENOL PO Take 650 mg by mouth as needed.   VEGETABLE LAXATIVE PO Take by mouth as needed.   VITAMIN D3 PO Take 800 mg by mouth 2 (two) times daily.       Known medication allergies: Allergies  Allergen Reactions  . Avelox [Moxifloxacin Hcl In Nacl] Swelling    Throat swelling.  . Benzoxonium Chloride [Benzoxonium]     Eyes red, pain, itching  . Celebrex [Celecoxib] Other (See Comments)    Throat, mouth thickness  . Penicillins Other (See Comments)    Itching at injection site     Physical examination: Blood pressure 140/80, pulse 71, resp. rate 18.  General: Alert, interactive, in no acute distress. HEENT: PERRLA, TMs pearly gray, erythematous turbinates moderately edematous without discharge, post-pharynx non erythematous. Neck: Supple without lymphadenopathy. Lungs: Clear to auscultation without wheezing, rhonchi or rales. Mild increased work of breathing with heavy breathing CV: Normal S1, S2 without murmurs. Abdomen: Nondistended, nontender. Skin: Warm and dry, without lesions or  rashes. Extremities:  No clubbing, cyanosis or edema. Neuro:   Grossly intact.  Diagnositics/Labs:  Spirometry: FEV1: 1.39L 71%, FVC: 1.96L 75% function is slightly reduced from previous study  Assessment and plan:   Acute sinusitis with asthma exacerbation   - completed 2 rounds of azithromycin with continued symptoms of HA, sinus pressure/pain, cough, wheeze, sore throat and hoarseness   - will expand antibiotic coverage to Doxycycline 100mg  twice a day x 7 days   - take prednisone 20mg  twice a day x 5 days   - maintain adequate hydration and rest  Allergic rhinitis   - continue avoidance measures for molds.     - for nasal congestion continue Xhance 2 sprays 1-2 times a day (resume use after completion of above medications).      - for nasal  drainage/sore throat/throat clearing continue use nasal antihistamine, Astelin, 2 sprays each nostril twice a day.    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  May use 15-20 minutes prior to activity.   Monitor frequency of use.      - continue Symbicort 166mcg 2 puffs twice a day at this time.   Control goals:   Full participation in all desired activities (may need albuterol before activity)  Albuterol use two time or less a week on average (not counting use with activity)  Cough interfering with sleep two time or less a month  Oral steroids no more than once a year  No hospitalizations Advised to get the flu vaccine once she is feeling better   Follow-up 3-4 months or sooner if needed  I appreciate the opportunity to take part in Brandy Torres's care. Please do not hesitate to contact me with questions.  Sincerely,   Prudy Feeler, MD Allergy/[facility]ology [facility]

## 2018-09-05 DIAGNOSIS — Z23 Encounter for immunization: Secondary | ICD-10-CM | POA: Diagnosis not present

## 2018-09-15 ENCOUNTER — Encounter: Payer: Self-pay | Admitting: Gastroenterology

## 2018-09-24 ENCOUNTER — Encounter: Payer: Self-pay | Admitting: Gastroenterology

## 2018-09-26 ENCOUNTER — Ambulatory Visit (INDEPENDENT_AMBULATORY_CARE_PROVIDER_SITE_OTHER): Payer: Medicare Other | Admitting: Gastroenterology

## 2018-09-26 ENCOUNTER — Other Ambulatory Visit (INDEPENDENT_AMBULATORY_CARE_PROVIDER_SITE_OTHER): Payer: Medicare Other

## 2018-09-26 ENCOUNTER — Encounter: Payer: Self-pay | Admitting: Gastroenterology

## 2018-09-26 VITALS — BP 152/102 | HR 79 | Ht 62.0 in | Wt 193.0 lb

## 2018-09-26 DIAGNOSIS — Z1211 Encounter for screening for malignant neoplasm of colon: Secondary | ICD-10-CM

## 2018-09-26 DIAGNOSIS — K581 Irritable bowel syndrome with constipation: Secondary | ICD-10-CM

## 2018-09-26 DIAGNOSIS — R112 Nausea with vomiting, unspecified: Secondary | ICD-10-CM

## 2018-09-26 DIAGNOSIS — R1013 Epigastric pain: Secondary | ICD-10-CM

## 2018-09-26 DIAGNOSIS — K219 Gastro-esophageal reflux disease without esophagitis: Secondary | ICD-10-CM

## 2018-09-26 MED ORDER — NA SULFATE-K SULFATE-MG SULF 17.5-3.13-1.6 GM/177ML PO SOLN: 1.0000 | Freq: Once | ORAL | 0 refills | Status: AC

## 2018-09-26 MED ORDER — DEXLANSOPRAZOLE 60 MG PO CPDR: 60.0000 mg | DELAYED_RELEASE_CAPSULE | Freq: Every day | ORAL | 0 refills | Status: DC

## 2018-09-26 NOTE — Progress Notes (Signed)
Chief Complaint: Abdominal pain  Referring Provider:  Cher Nakai, MD      ASSESSMENT AND PLAN;   #1. Epigastric pain.  D/d PUD, GERD, gastritis, nonulcer dyspepsia, gastroparesis, musculoskeletal etiology, r/o gallbladder or pancreatic problems. #2. N/V (better) #3. GERD #4. IBS with predom constipation. Now with occ diarrhea. #5. History of colonic polyps  Plan: - Proceed with EGD and colonoscopy.  I have discussed the risks & benefits.  The risks including risk of perforation requiring laparotomy, bleeding after biopsies/polypectomy requiring blood transfusion and risks of anesthesia/sedation were discussed.  Rare risks of missing UGI and colorectal neoplasms were also discussed. Consent forms given for review. - [place] abdo complete. - Dexilant 60mg  po qd x 2 weeks (samples given), then can resume nexium 20mg  po qd. - CBC, CMP, lipase, CRP and celiac screen. (TSH being followed by Dr Truman Hayward). - If still with problems and the above work-up is negative, proceed with CT scan abdo/pelvis. - Increase water intake. - Minimize pain medications.   HPI:    Brandy Torres is a 73 y.o. female  abdo pain with intermittent epigastric pain associated with intermittent nausea/vomiting Over last 1 year. Pain is-sharp, more or less constant, nonradiating, exacerbated after eating Denies having any dysphagia or odynophagia.  Does complain of heartburn despite Nexium Alternating diarrhea and constipation.  Deviously had more constipation. Does complain of abdominal bloating. No weight loss Denies having any nocturnal symptoms No melena (stool becomes dark when she takes Pepto-Bismol) or hematochezia.  Past GI procedures: -EGD 11/2012-small hiatal hernia, gastric polyps (Bx-hyperplastic) -Colonoscopy 12/2009 (PCF, somewhat difficult)-mild sigmoid diverticulosis, otherwise normal to TI.  Has previous history of colonic polyps. Past Medical History:  Diagnosis Date  . Acid reflux   . Allergic  rhinoconjunctivitis   . Anemia   . Anxiety   . Asthma   . AV nodal re-entry tachycardia [facility])    Cardiac Ablation  . Chronic headaches   . Colon polyps   . Depression   . Duodenal ulcer   . Fibromyalgia   . GERD (gastroesophageal reflux disease)   . High blood pressure   . High cholesterol   . Hypothyroidism   . IBS (irritable bowel syndrome)   . Osteoarthritis   . Osteoporosis   . UTI (urinary tract infection)     Past Surgical History:  Procedure Laterality Date  . ABLATION  2000   Cardiac ablation  . ADENOIDECTOMY    . BLADDER SURGERY  2002   Bladder Tack and Sling  . CATARACT EXTRACTION  2016  . COLONOSCOPY  12/14/2009   Mild sigmoid diverticulosis. Small internal hemorrhoids. Otherwise normal colonoscopy to TI.   Marland Kitchen ESOPHAGOGASTRODUODENOSCOPY  11/10/2012   Small hiatal hernia. Gastric polyps (status post polypectomy x2) with slightly increased size of the gastric polyps as compared to the previous EGD.   Marland Kitchen KNEE ARTHROSCOPY Right 2011  . THYROIDECTOMY  1984  . TONSILLECTOMY     age 46  . TUBAL LIGATION  1982    Family History  Problem Relation Age of Onset  . COPD Father   . Coronary artery disease Father   . Rheum arthritis Sister   . Breast cancer Maternal Aunt   . Breast cancer Maternal Grandmother   . Paget's disease of bone Maternal Aunt   . Allergic rhinitis Son   . Dystonia Son   . Rheum arthritis Son   . Diabetes Sister   . Colonic polyp Mother   . Stomach cancer Paternal Aunt   .  Pancreatic cancer Paternal Uncle   . Pancreatic cancer Paternal Uncle     Social History   Tobacco Use  . Smoking status: Never Smoker  . Smokeless tobacco: Never Used  Substance Use Topics  . Alcohol use: Never    Frequency: Never  . Drug use: Never    Current Outpatient Medications  Medication Sig Dispense Refill  . Acetaminophen (TYLENOL PO) Take 650 mg by mouth as needed.    Marland Kitchen alendronate (FOSAMAX) 70 MG tablet Take 70 mg by mouth once a week.   12  .  amLODipine (NORVASC) 5 MG tablet Take 5 mg by mouth daily.   5  . aspirin EC 81 MG tablet Take 81 mg by mouth daily.    Marland Kitchen azelastine (ASTELIN) 0.1 % nasal spray Use two sprays in each nostril twice daily as directed 30 mL 5  . Bismuth Subsalicylate (PEPTO-BISMOL PO) Take 1 tablet by mouth as needed.    . budesonide-formoterol (SYMBICORT) 160-4.5 MCG/ACT inhaler Inhale two puffs twice daily to prevent cough or wheeze.  Rinse, gargle, and spit after use. 1 Inhaler 5  . buPROPion (WELLBUTRIN XL) 300 MG 24 hr tablet Take 300 mg by mouth daily.  5  . Calcium Carb-Cholecalciferol (CALCIUM 600+D3) 600-800 MG-UNIT TABS Take 1 tablet by mouth 2 (two) times daily.    Marland Kitchen escitalopram (LEXAPRO) 20 MG tablet Take 20 mg by mouth daily.   5  . esomeprazole (NEXIUM) 20 MG capsule Take 20 mg by mouth daily at 12 noon.    Marland Kitchen FIBER PO Take by mouth as needed.    . Fluticasone Propionate (XHANCE NA) Place 2 sprays into the nose 2 (two) times daily.    . Glucosamine HCl (GLUCOSAMINE PO) Take by mouth 2 (two) times daily.    Marland Kitchen levalbuterol (XOPENEX HFA) 45 MCG/ACT inhaler Inhale two puffs every 4-6 hours if needed for cough or wheeze 1 Inhaler 1  . levothyroxine (SYNTHROID, LEVOTHROID) 175 MCG tablet Take 175 mcg by mouth daily.    . Methylsulfonylmethane (MSM) 1500 MG TABS Take by mouth 2 (two) times daily.    . montelukast (SINGULAIR) 10 MG tablet Take 1 tablet (10 mg total) by mouth at bedtime. 30 tablet 5  . Multiple Vitamins-Minerals (MULTIVITAMIN ADULT PO) Take by mouth daily.    . Psyllium (VEGETABLE LAXATIVE PO) Take by mouth as needed.    . rosuvastatin (CRESTOR) 20 MG tablet Take 20 mg by mouth daily.   3   No current facility-administered medications for this visit.     Allergies  Allergen Reactions  . Avelox [Moxifloxacin Hcl In Nacl] Swelling    Throat swelling.  . Benzoxonium Chloride [Benzoxonium]     Eyes red, pain, itching  . Celebrex [Celecoxib] Other (See Comments)    Throat, mouth thickness   . Penicillins Other (See Comments)    Itching at injection site    Review of Systems:  Constitutional: Denies fever, chills, diaphoresis, appetite change and fatigue.  HEENT: Denies photophobia, eye pain, redness, has hearing problems. No Mouth sores, neck pain, neck stiffness and tinnitus.  Has sinus problems. Respiratory: Denies SOB, DOE, chest tightness,  and wheezing.  Has cough. Cardiovascular: Denies chest pain, palpitations and leg swelling.  Genitourinary: Denies dysuria, urgency, frequency, hematuria, flank pain and difficulty urinating.  Has urinary incontinence Musculoskeletal: Has myalgias, back pain, joint swelling, arthralgias and gait problem.  Skin: No rash.  Neurological: Denies dizziness, seizures, syncope, weakness, light-headedness, numbness and headaches.  Hematological: Denies adenopathy. Easy bruising,  personal or family bleeding history  Psychiatric/Behavioral: Has anxiety or depression     Physical Exam:    BP (!) 152/102   Pulse 79   Ht 5\' 2"  (1.575 m)   Wt 193 lb (87.5 kg)   BMI 35.30 kg/m  Filed Weights   09/26/18 1322  Weight: 193 lb (87.5 kg)   Constitutional:  Well-developed, in no acute distress. Psychiatric: Normal mood and affect. Behavior is normal. HEENT: Pupils normal.  Conjunctivae are normal. No scleral icterus. Neck supple.  Cardiovascular: Normal rate, regular rhythm. No edema Pulmonary/chest: Effort normal and breath sounds normal. No wheezing, rales or rhonchi. Abdominal: Soft, nondistended. Nontender. Bowel sounds active throughout. There are no masses palpable. No hepatomegaly. Rectal:  defered Neurological: Alert and oriented to person place and time. Skin: Skin is warm and dry. No rashes noted.  Data Reviewed: I have personally reviewed following labs and imaging studies    Carmell Austria, MD 09/26/2018, 1:50 PM  Cc: Cher Nakai, MD

## 2018-09-26 NOTE — Patient Instructions (Addendum)
If you are age 74 or older, your body mass index should be between 23-30. Your Body mass index is 35.3 kg/m. If this is out of the aforementioned range listed, please consider follow up with your Primary Care Provider.  If you are age 22 or younger, your body mass index should be between 19-25. Your Body mass index is 35.3 kg/m. If this is out of the aformentioned range listed, please consider follow up with your Primary Care Provider.   You have been scheduled for an endoscopy and colonoscopy. Please follow the written instructions given to you at your visit today. Please pick up your prep supplies at the pharmacy within the next 1-3 days. If you use inhalers (even only as needed), please bring them with you on the day of your procedure. Your physician has requested that you go to www.startemmi.com and enter the access code given to you at your visit today. This web site gives a general overview about your procedure. However, you should still follow specific instructions given to you by our office regarding your preparation for the procedure.   You have been scheduled for an abdominal ultrasound at [facility] (1st floor [address] ) on 10/02/2018 at 9:00am. Please arrive 15 minutes prior to your appointment for registration. Make certain not to have anything to eat or drink 6 hours prior to your appointment. Should you need to reschedule your appointment, please contact radiology at [phone]. This test typically takes about 30 minutes to perform.  We have sent the following medications to your pharmacy for you to pick up at your convenience: Suprep  Please purchase the following medications over the counter and take as directed: Miralax  You have been give a sample of  Dexilant 60mg  (15 capsules)  Thank you,  Dr. Jackquline Denmark

## 2018-09-27 LAB — CBC WITH DIFFERENTIAL/PLATELET
Absolute Monocytes: 517 cells/uL (ref 200–950)
Basophils Absolute: 47 cells/uL (ref 0–200)
Basophils Relative: 0.5 %
Eosinophils Absolute: 160 cells/uL (ref 15–500)
Eosinophils Relative: 1.7 %
HCT: 39.4 % (ref 35.0–45.0)
HEMOGLOBIN: 13.4 g/dL (ref 11.7–15.5)
Lymphs Abs: 2886 cells/uL (ref 850–3900)
MCH: 29.7 pg (ref 27.0–33.0)
MCHC: 34 g/dL (ref 32.0–36.0)
MCV: 87.4 fL (ref 80.0–100.0)
MPV: 9.8 fL (ref 7.5–12.5)
Monocytes Relative: 5.5 %
Neutro Abs: 5790 cells/uL (ref 1500–7800)
Neutrophils Relative %: 61.6 %
Platelets: 236 10*3/uL (ref 140–400)
RBC: 4.51 10*6/uL (ref 3.80–5.10)
RDW: 13.5 % (ref 11.0–15.0)
Total Lymphocyte: 30.7 %
WBC: 9.4 10*3/uL (ref 3.8–10.8)

## 2018-09-27 LAB — COMPREHENSIVE METABOLIC PANEL
AG Ratio: 1.9 (calc) (ref 1.0–2.5)
ALBUMIN MSPROF: 4.3 g/dL (ref 3.6–5.1)
ALKALINE PHOSPHATASE (APISO): 51 U/L (ref 33–130)
ALT: 13 U/L (ref 6–29)
AST: 17 U/L (ref 10–35)
BUN: 22 mg/dL (ref 7–25)
CO2: 24 mmol/L (ref 20–32)
CREATININE: 0.72 mg/dL (ref 0.60–0.93)
Calcium: 10.3 mg/dL (ref 8.6–10.4)
Chloride: 103 mmol/L (ref 98–110)
Globulin: 2.3 g/dL (calc) (ref 1.9–3.7)
Glucose, Bld: 89 mg/dL (ref 65–99)
Potassium: 3.7 mmol/L (ref 3.5–5.3)
Sodium: 139 mmol/L (ref 135–146)
Total Bilirubin: 0.4 mg/dL (ref 0.2–1.2)
Total Protein: 6.6 g/dL (ref 6.1–8.1)

## 2018-09-27 LAB — TSH: TSH: 0.22 m[IU]/L — AB (ref 0.40–4.50)

## 2018-09-27 LAB — C-REACTIVE PROTEIN: CRP: 2.5 mg/L (ref <8.0)

## 2018-09-27 LAB — LIPASE: Lipase: 14 U/L (ref 7–60)

## 2018-09-30 LAB — CELIAC PANEL 10
Antigliadin Abs, IgA: 3 units (ref 0–19)
ENDOMYSIAL IGA: NEGATIVE
Gliadin IgG: 1 units (ref 0–19)
IGA/IMMUNOGLOBULIN A, SERUM: 91 mg/dL (ref 64–422)
Tissue Transglut Ab: <2 U/mL (ref 0–5)
Transglutaminase IgA: <2 U/mL (ref 0–3)

## 2018-10-01 ENCOUNTER — Other Ambulatory Visit: Payer: Self-pay

## 2018-10-01 DIAGNOSIS — E039 Hypothyroidism, unspecified: Secondary | ICD-10-CM

## 2018-10-02 ENCOUNTER — Ambulatory Visit (HOSPITAL_BASED_OUTPATIENT_CLINIC_OR_DEPARTMENT_OTHER)
Admission: RE | Admit: 2018-10-02 | Discharge: 2018-10-02 | Disposition: A | Discharge status: Home / Self Care | Payer: Medicare Other | Source: Ambulatory Visit | Attending: Gastroenterology | Admitting: Gastroenterology

## 2018-10-02 DIAGNOSIS — R112 Nausea with vomiting, unspecified: Secondary | ICD-10-CM | POA: Diagnosis not present

## 2018-10-02 DIAGNOSIS — K219 Gastro-esophageal reflux disease without esophagitis: Secondary | ICD-10-CM | POA: Insufficient documentation

## 2018-10-02 DIAGNOSIS — R1013 Epigastric pain: Secondary | ICD-10-CM | POA: Insufficient documentation

## 2018-10-02 DIAGNOSIS — K581 Irritable bowel syndrome with constipation: Secondary | ICD-10-CM | POA: Diagnosis not present

## 2018-10-02 DIAGNOSIS — Z1211 Encounter for screening for malignant neoplasm of colon: Secondary | ICD-10-CM | POA: Insufficient documentation

## 2018-10-02 DIAGNOSIS — K7689 Other specified diseases of liver: Secondary | ICD-10-CM | POA: Diagnosis not present

## 2018-10-02 NOTE — Progress Notes (Signed)
Patient's medical info faxed to [facility] and amb ref to endocrinology entered into Epic; patient will be notified by [facility] when appt has been made;

## 2018-10-09 ENCOUNTER — Ambulatory Visit (AMBULATORY_SURGERY_CENTER): Payer: Medicare Other | Admitting: Gastroenterology

## 2018-10-09 ENCOUNTER — Encounter: Payer: Self-pay | Admitting: Gastroenterology

## 2018-10-09 VITALS — BP 128/60 | HR 77 | Temp 98.6°F | Resp 18 | Ht 62.0 in | Wt 193.0 lb

## 2018-10-09 DIAGNOSIS — K52832 Lymphocytic colitis: Secondary | ICD-10-CM | POA: Diagnosis not present

## 2018-10-09 DIAGNOSIS — K449 Diaphragmatic hernia without obstruction or gangrene: Secondary | ICD-10-CM

## 2018-10-09 DIAGNOSIS — R1013 Epigastric pain: Secondary | ICD-10-CM

## 2018-10-09 DIAGNOSIS — K649 Unspecified hemorrhoids: Secondary | ICD-10-CM

## 2018-10-09 DIAGNOSIS — K317 Polyp of stomach and duodenum: Secondary | ICD-10-CM | POA: Diagnosis not present

## 2018-10-09 DIAGNOSIS — Z1211 Encounter for screening for malignant neoplasm of colon: Secondary | ICD-10-CM

## 2018-10-09 DIAGNOSIS — K589 Irritable bowel syndrome without diarrhea: Secondary | ICD-10-CM | POA: Diagnosis not present

## 2018-10-09 DIAGNOSIS — K5730 Diverticulosis of large intestine without perforation or abscess without bleeding: Secondary | ICD-10-CM

## 2018-10-09 MED ORDER — DEXLANSOPRAZOLE 60 MG PO CPDR: 60.0000 mg | DELAYED_RELEASE_CAPSULE | Freq: Every day | ORAL | 6 refills | Status: DC

## 2018-10-09 MED ORDER — SODIUM CHLORIDE 0.9 % IV SOLN: 500.0000 mL | Freq: Once | INTRAVENOUS | Status: DC

## 2018-10-09 NOTE — Op Note (Signed)
[place] Patient Name: [place] Procedure Date: 10/09/2018 1:48 PM MRN: [id number] Endoscopist: Jackquline Denmark , MD Age: 73 Referring MD:  Date of Birth: September 20, 1945 Gender: Female Account #: [id number] Procedure:                Colonoscopy Indications:              History of colonic polyps. Alternating constipation                            and diarrhea. Medicines:                Monitored Anesthesia Care Procedure:                Pre-Anesthesia Assessment:                           - Prior to the procedure, a History and Physical                            was performed, and patient medications and                            allergies were reviewed. The patient's tolerance of                            previous anesthesia was also reviewed. The risks                            and benefits of the procedure and the sedation                            options and risks were discussed with the patient.                            All questions were answered, and informed consent                            was obtained. Prior Anticoagulants: The patient has                            taken aspirin, last dose was day of procedure. ASA                            Grade Assessment: III - A patient with severe                            systemic disease. After reviewing the risks and                            benefits, the patient was deemed in satisfactory                            condition to undergo the procedure.  After obtaining informed consent, the colonoscope                            was passed under direct vision. Throughout the                            procedure, the patient's blood pressure, pulse, and                            oxygen saturations were monitored continuously. The                            Colonoscope was introduced through the anus and                            advanced to the 2 cm into the ileum. The                 colonoscopy was performed without difficulty. The                            patient tolerated the procedure well. The quality                            of the bowel preparation was excellent. The                            terminal ileum, ileocecal valve, appendiceal                            orifice, and rectum were photographed. Scope In: 2:09:59 PM Scope Out: 2:22:18 PM Scope Withdrawal Time: 0 hours 8 minutes 18 seconds  Total Procedure Duration: 0 hours 12 minutes 19 seconds  Findings:                 Multiple small-mouthed diverticula were found in                            the sigmoid colon and descending colon.                           Non-bleeding internal hemorrhoids were found during                            retroflexion. The hemorrhoids were small.                           Biopsies for histology were taken with a cold                            forceps from the entire colon for evaluation of                            microscopic colitis.  The exam was otherwise without abnormality on                            direct and retroflexion views.                           The terminal ileum appeared normal. Complications:            No immediate complications. Estimated Blood Loss:     Estimated blood loss: none. Impression:               -Moderate predominantly sigmoid diverticulosis.                           -Small internal hemorrhoids.                           -Otherwise normal colonoscopy to TI. Recommendation:           - Patient has a contact number available for                            emergencies. The signs and symptoms of potential                            delayed complications were discussed with the                            patient. Return to normal activities tomorrow.                            Written discharge instructions were provided to the                            patient.                           -  Resume high-fiber diet.                           - Benefiber 1 tablespoon p.o. every afternoon with                            8 ounces of water.                           - Continue present medications.                           - Await pathology results.                           - Return to GI clinic in 12 weeks. Jackquline Denmark, MD 10/09/2018 2:29:54 PM This report has been signed electronically.

## 2018-10-09 NOTE — Progress Notes (Signed)
To PACU, VSS. Report to [email or web address] 

## 2018-10-09 NOTE — Op Note (Signed)
[place] Patient Name: [place] Procedure Date: 10/09/2018 1:48 PM MRN: [id number] Endoscopist: Jackquline Denmark , MD Age: 73 Referring MD:  Date of Birth: July 29, 1946 Gender: Female Account #: [id number] Procedure:                Upper GI endoscopy Indications:              Epigastric abdominal pain, GERD Medicines:                Monitored Anesthesia Care Procedure:                Pre-Anesthesia Assessment:                           - Prior to the procedure, a History and Physical                            was performed, and patient medications and                            allergies were reviewed. The patient's tolerance of                            previous anesthesia was also reviewed. The risks                            and benefits of the procedure and the sedation                            options and risks were discussed with the patient.                            All questions were answered, and informed consent                            was obtained. Prior Anticoagulants: The patient has                            taken aspirin, last dose was day of procedure. ASA                            Grade Assessment: III - A patient with severe                            systemic disease. After reviewing the risks and                            benefits, the patient was deemed in satisfactory                            condition to undergo the procedure.                           - Prior to the procedure, a History and Physical  was performed, and patient medications and                            allergies were reviewed. The patient's tolerance of                            previous anesthesia was also reviewed. The risks                            and benefits of the procedure and the sedation                            options and risks were discussed with the patient.                            All questions were answered, and  informed consent                            was obtained. Prior Anticoagulants: The patient has                            taken aspirin, last dose was day of procedure. ASA                            Grade Assessment: III - A patient with severe                            systemic disease. After reviewing the risks and                            benefits, the patient was deemed in satisfactory                            condition to undergo the procedure.                           After obtaining informed consent, the endoscope was                            passed under direct vision. Throughout the                            procedure, the patient's blood pressure, pulse, and                            oxygen saturations were monitored continuously. The                            Endoscope was introduced through the mouth, and                            advanced to the second part of duodenum. The upper  GI endoscopy was accomplished without difficulty.                            The patient tolerated the procedure well. Scope In: Scope Out: Findings:                 The Z-line was regular and was found 32 cm from the                            incisors. The esophagus was shortened and tortuous                            in the lower one third of the esophagus due to                            hiatal hernia.                           A 6 cm hiatal hernia was present.                           Multiple 6 to 8 mm semi-sessile polyps with no                            bleeding and no stigmata of recent bleeding were                            found in the gastric fundus and in the gastric                            body. 2 polyps were removed with a cold biopsy                            forceps. Resection and retrieval were complete.                            Estimated blood loss: none.                           The examined duodenum was normal. Biopsies for                             histology were taken with a cold forceps for                            evaluation of celiac disease. Complications:            No immediate complications. Estimated Blood Loss:     Estimated blood loss: none. Impression:               - Hiatal hernia.                           - Multiple gastric polyps. (Resected and retrieved  x 2, to confirm hyperplastic). Recommendation:           - Patient has a contact number available for                            emergencies. The signs and symptoms of potential                            delayed complications were discussed with the                            patient. Return to normal activities tomorrow.                            Written discharge instructions were provided to the                            patient.                           - Resume previous diet.                           - Continue Dexilant 60 mg p.o. once a day #30, 6                            refills. She has tried multiple PPIs including                            omeprazole, Nexium and previously Protonix and                            failed.                           - Brochures regarding GERD. Avoid eating 3 hours                            before she goes to bed, can raise the head end of                            the bed by 6 to 8 inches blocks, avoid sodas                            chocolates and mints.                           - Await pathology results.                           - Return to GI clinic in 12 weeks. Jackquline Denmark, MD 10/09/2018 2:36:59 PM This report has been signed electronically.

## 2018-10-09 NOTE — Patient Instructions (Signed)
Please read handouts provided. Antireflux measures. Continue Dexilant 60 mg once a day. Return to GI clinic in 12 weeks. Continue present medications. Benefiber 1 tablespoon every afternoon with 8 ounces of water Await pathology results.      YOU HAD AN ENDOSCOPIC PROCEDURE TODAY AT [facility]:   Refer to the procedure report that was given to you for any specific questions about what was found during the examination.  If the procedure report does not answer your questions, please call your gastroenterologist to clarify.  If you requested that your care partner not be given the details of your procedure findings, then the procedure report has been included in a sealed envelope for you to review at your convenience later.  YOU SHOULD EXPECT: Some feelings of bloating in the abdomen. Passage of more gas than usual.  Walking can help get rid of the air that was put into your GI tract during the procedure and reduce the bloating. If you had a lower endoscopy (such as a colonoscopy or flexible sigmoidoscopy) you may notice spotting of blood in your stool or on the toilet paper. If you underwent a bowel prep for your procedure, you may not have a normal bowel movement for a few days.  Please Note:  You might notice some irritation and congestion in your nose or some drainage.  This is from the oxygen used during your procedure.  There is no need for concern and it should clear up in a day or so.  SYMPTOMS TO REPORT IMMEDIATELY:   Following lower endoscopy (colonoscopy or flexible sigmoidoscopy):  Excessive amounts of blood in the stool  Significant tenderness or worsening of abdominal pains  Swelling of the abdomen that is new, acute  Fever of 100F or higher   Following upper endoscopy (EGD)  Vomiting of blood or coffee ground material  New chest pain or pain under the shoulder blades  Painful or persistently difficult swallowing  New shortness of breath  Fever of 100F  or higher  Black, tarry-looking stools  For urgent or emergent issues, a gastroenterologist can be reached at any hour by calling [phone].   DIET:  We do recommend a small meal at first, but then you may proceed to your regular diet.  Drink plenty of fluids but you should avoid alcoholic beverages for 24 hours.  ACTIVITY:  You should plan to take it easy for the rest of today and you should NOT DRIVE or use heavy machinery until tomorrow (because of the sedation medicines used during the test).    FOLLOW UP: Our staff will call the number listed on your records the next business day following your procedure to check on you and address any questions or concerns that you may have regarding the information given to you following your procedure. If we do not reach you, we will leave a message.  However, if you are feeling well and you are not experiencing any problems, there is no need to return our call.  We will assume that you have returned to your regular daily activities without incident.  If any biopsies were taken you will be contacted by phone or by letter within the next 1-3 weeks.  Please call us at [phone] if you have not heard about the biopsies in 3 weeks.    SIGNATURES/CONFIDENTIALITY: You and/or your care partner have signed paperwork which will be entered into your electronic medical record.  These signatures attest to the fact that that the  information above on your After Visit Summary has been reviewed and is understood.  Full responsibility of the confidentiality of this discharge information lies with you and/or your care-partner.

## 2018-10-10 ENCOUNTER — Telehealth: Payer: Self-pay

## 2018-10-10 NOTE — Telephone Encounter (Signed)
  Follow up Call-  Call back number 10/09/2018  Post procedure Call Back phone  # [phone]  Permission to leave phone message Yes  Some recent data might be hidden     Patient questions:  Do you have a fever, pain , or abdominal swelling? No. Pain Score  0 *  Have you tolerated food without any problems? Yes.    Have you been able to return to your normal activities? Yes.    Do you have any questions about your discharge instructions: Diet   No. Medications  No. Follow up visit  No.  Do you have questions or concerns about your Care? No.  Actions: * If pain score is 4 or above: No action needed, pain <4.

## 2018-10-10 NOTE — Telephone Encounter (Signed)
Patient states last night when arriving home felt nausea, vomited small amount of fluid, felt weak. Patient slept thru night well. Patient states feeling better this morning, though still feeling weak. Patient encouraged to push fluids today and to call [facility] if having any issues or concerns today. B.Marlayna Bannister RN

## 2018-10-13 DIAGNOSIS — H04123 Dry eye syndrome of bilateral lacrimal glands: Secondary | ICD-10-CM | POA: Diagnosis not present

## 2018-10-13 DIAGNOSIS — H26491 Other secondary cataract, right eye: Secondary | ICD-10-CM | POA: Diagnosis not present

## 2018-10-15 ENCOUNTER — Telehealth: Payer: Self-pay

## 2018-10-15 NOTE — Telephone Encounter (Signed)
Called and spoke to patient's husband and told that I am sending a patient assistant program paperwork to patient's address in regards to the cost since [facility] told me that it would be 295 to fill her medication. I told him to tell her to give [place] a call back to see if it is ok to send the paperwork back or if they need to bring the paperwork back. Verified address and was told ok that is fine.

## 2018-10-22 ENCOUNTER — Encounter: Payer: Self-pay | Admitting: Endocrinology

## 2018-10-22 ENCOUNTER — Ambulatory Visit (INDEPENDENT_AMBULATORY_CARE_PROVIDER_SITE_OTHER): Payer: Medicare Other | Admitting: Endocrinology

## 2018-10-22 DIAGNOSIS — E039 Hypothyroidism, unspecified: Secondary | ICD-10-CM | POA: Insufficient documentation

## 2018-10-22 DIAGNOSIS — E890 Postprocedural hypothyroidism: Secondary | ICD-10-CM | POA: Diagnosis not present

## 2018-10-22 LAB — TSH: TSH: 0.53 u[IU]/mL (ref 0.35–4.50)

## 2018-10-22 LAB — T3, FREE: T3, Free: 3.1 pg/mL (ref 2.3–4.2)

## 2018-10-22 LAB — T4, FREE: Free T4: 1.02 ng/dL (ref 0.60–1.60)

## 2018-10-22 MED ORDER — LEVOTHYROXINE SODIUM 137 MCG PO TABS: 137.0000 ug | ORAL_TABLET | Freq: Every day | ORAL | 11 refills | Status: DC

## 2018-10-22 NOTE — Progress Notes (Signed)
Subjective:    Patient ID: Brandy Torres, female    DOB: [birth year], 73 y.o.   MRN: [id number]  HPI Pt is referred by Dr Lyndel Safe, for hypothyroidism.  Pt had XRT to the anterior neck in 1946.  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  she has been on prescribed thyroid hormone therapy since then.  she has never taken kelp or any other type of non-prescribed thyroid product.  she has never been on amiodarone.  She has not recently taken lithium.  She takes synthroid 137 mcg/day.  She has been on this dosage x 1 month.  She reports severe fatigue, and assoc DOE.   Past Medical History:  Diagnosis Date  . Acid reflux   . Allergic rhinoconjunctivitis   . Allergy   . Anemia   . Anxiety   . Asthma   . AV nodal re-entry tachycardia [facility])    Cardiac Ablation  . Cataract   . Chronic headaches   . Colon polyps   . Depression   . Duodenal ulcer   . Fibromyalgia   . GERD (gastroesophageal reflux disease)   . High blood pressure   . High cholesterol   . Hypothyroidism   . IBS (irritable bowel syndrome)   . Lymphoma ([place])    multiple and benign per patient   . Osteoarthritis   . Osteoporosis   . UTI (urinary tract infection)     Past Surgical History:  Procedure Laterality Date  . ABLATION  2000   Cardiac ablation  . ADENOIDECTOMY    . BLADDER SURGERY  2002   Bladder Tack and Sling  . CATARACT EXTRACTION  2016  . COLONOSCOPY  12/14/2009   Mild sigmoid diverticulosis. Small internal hemorrhoids. Otherwise normal colonoscopy to TI.   Mar[facility] Kitchen ESOPHAGOGASTRODUODENOSCOPY  11/10/2012   Small hiatal hernia. Gastric polyps (status post polypectomy x2) with slightly increased size of the gastric polyps as compared to the previous EGD.   Marland Kitchen KNEE ARTHROSCOPY Right 2011  . THYROIDECTOMY  1984  . TONSILLECTOMY     age 82  . TUBAL LIGATION  1982  . UPPER GASTROINTESTINAL ENDOSCOPY      Social History   Socioeconomic History  . Marital status: Married      Spouse name: Not on file  . Number of children: 1  . Years of education: Not on file  . Highest education level: Not on file  Occupational History  . Occupation: Retired   Scientific laboratory technician  . Financial resource strain: Not on file  . Food insecurity:    Worry: Not on file    Inability: Not on file  . Transportation needs:    Medical: Not on file    Non-medical: Not on file  Tobacco Use  . Smoking status: Never Smoker  . Smokeless tobacco: Never Used  Substance and Sexual Activity  . Alcohol use: Never    Frequency: Never  . Drug use: Never  . Sexual activity: Not on file  Lifestyle  . Physical activity:    Days per week: Not on file    Minutes per session: Not on file  . Stress: Not on file  Relationships  . Social connections:    Talks on phone: Not on file    Gets together: Not on file    Attends religious service: Not on file    Active member of club or organization: Not on file    Attends meetings  of clubs or organizations: Not on file    Relationship status: Not on file  . Intimate partner violence:    Fear of current or ex partner: Not on file    Emotionally abused: Not on file    Physically abused: Not on file    Forced sexual activity: Not on file  Other Topics Concern  . Not on file  Social History Narrative  . Not on file    Current Outpatient Medications on File Prior to Visit  Medication Sig Dispense Refill  . Acetaminophen (TYLENOL PO) Take 650 mg by mouth as needed.    Marland Kitchen amLODipine (NORVASC) 5 MG tablet Take 5 mg by mouth daily.   5  . aspirin EC 81 MG tablet Take 81 mg by mouth daily.    Marland Kitchen azelastine (ASTELIN) 0.1 % nasal spray Use two sprays in each nostril twice daily as directed 30 mL 5  . budesonide-formoterol (SYMBICORT) 160-4.5 MCG/ACT inhaler Inhale two puffs twice daily to prevent cough or wheeze.  Rinse, gargle, and spit after use. 1 Inhaler 5  . buPROPion (WELLBUTRIN XL) 300 MG 24 hr tablet Take 300 mg by mouth daily.  5  . Ca  Carbonate-Mag Hydroxide (ROLAIDS PO) Take 1 tablet by mouth as needed.    . Calcium Carb-Cholecalciferol (CALCIUM 600+D3) 600-800 MG-UNIT TABS Take 1 tablet by mouth 2 (two) times daily.    . Calcium Carbonate Antacid (TUMS PO) Take 1 tablet by mouth as needed.    Marland Kitchen escitalopram (LEXAPRO) 20 MG tablet Take 20 mg by mouth daily.   5  . esomeprazole (NEXIUM) 20 MG capsule Take 20 mg by mouth daily at 12 noon.    Marland Kitchen FIBER PO Take by mouth as needed.    . Fluticasone Propionate (XHANCE NA) Place 2 sprays into the nose 2 (two) times daily.    . Glucosamine HCl (GLUCOSAMINE PO) Take by mouth 2 (two) times daily.    Marland Kitchen levalbuterol (XOPENEX HFA) 45 MCG/ACT inhaler Inhale two puffs every 4-6 hours if needed for cough or wheeze 1 Inhaler 1  . Methylsulfonylmethane (MSM) 1500 MG TABS Take by mouth 2 (two) times daily.    . montelukast (SINGULAIR) 10 MG tablet Take 1 tablet (10 mg total) by mouth at bedtime. 30 tablet 5  . Multiple Vitamins-Minerals (MULTIVITAMIN ADULT PO) Take by mouth daily.    . rosuvastatin (CRESTOR) 20 MG tablet Take 20 mg by mouth daily.   3  . Simethicone (GAS-X PO) Take 1 tablet by mouth as needed.    Marland Kitchen alendronate (FOSAMAX) 70 MG tablet Take 70 mg by mouth once a week.   12  . dexlansoprazole (DEXILANT) 60 MG capsule Take 1 capsule (60 mg total) by mouth daily. (Patient not taking: Reported on 10/22/2018) 30 capsule 6  . Psyllium (VEGETABLE LAXATIVE PO) Take by mouth as needed.     No current facility-administered medications on file prior to visit.     Allergies  Allergen Reactions  . Avelox [Moxifloxacin Hcl In Nacl] Swelling    Throat swelling.  . Benzoxonium Chloride [Benzoxonium]     Eyes red, pain, itching  . Celebrex [Celecoxib] Other (See Comments)    Throat, mouth thickness  . Penicillins Other (See Comments)    Itching at injection site    Family History  Problem Relation Age of Onset  . COPD Father   . Coronary artery disease Father   . Rheum arthritis  Sister   . Breast cancer Maternal Aunt   .  Breast cancer Maternal Grandmother   . Paget's disease of bone Maternal Aunt   . Allergic rhinitis Son   . Dystonia Son   . Rheum arthritis Son   . Diabetes Sister   . Colonic polyp Mother   . Stomach cancer Paternal Aunt   . Pancreatic cancer Paternal Uncle   . Pancreatic cancer Paternal Uncle   . Colon cancer Neg Hx   . Esophageal cancer Neg Hx   . Rectal cancer Neg Hx   . Thyroid disease Neg Hx     BP (!) 148/82 (BP Location: Right Arm, Patient Position: Sitting, Cuff Size: Large)   Pulse 75   Ht 5\' 2"  (1.575 m)   Wt 194 lb 3.2 oz (88.1 kg)   SpO2 97%   BMI 35.52 kg/m    Review of Systems denies hair loss, numbness, blurry vision, cold intolerance, and syncope.   She has weight gain, difficulty with concentration, headache, dry skin, rhinorrhea, easy bruising, constipation, myalgias, depression, and leg cramps.       Objective:   Physical Exam VS: see vs page GEN: no distress HEAD: head: no deformity.  bilat hearing aids.   eyes: no periorbital swelling, no proptosis.  external nose and ears are normal.  mouth: no lesion seen.  NECK: a healed scar is present.  I do not appreciate a nodule in the thyroid or elsewhere in the neck.  CHEST WALL: no deformity.  Old healed surgical scar at the left upper trapezius area.  LUNGS: clear to auscultation CV: reg rate and rhythm, no murmur ABD: abdomen is soft, nontender.  no hepatosplenomegaly.  not distended.  no hernia.  multiple sq lipomas.  MUSCULOSKELETAL: muscle bulk and strength are grossly normal.  no obvious joint swelling.  gait is normal and steady EXTEMITIES: no deformity.  no edema PULSES: no carotid bruit NEURO:  cn 2-12 grossly intact.   readily moves all 4's.  sensation is intact to touch on all 4's SKIN:  Normal texture and temperature.  No rash or suspicious lesion is visible.   NODES:  None palpable at the neck PSYCH: alert, well-oriented.  Does not appear anxious  nor depressed.  outside test results are reviewed: TSH=0.22  I have reviewed outside records, and summarized: Pt was noted to have suppressed TSH, and referred here.  Synthroid was decreased last month.  Main symptom was myalgias      Assessment & Plan:  Hypothyroidism, new to me.  Slightly overreplaced.  Fatigue, and other sxs: not thyroid-related.   Patient Instructions  Blood tests are requested for you today.  We'll let you know about the results.  Our goal is normal blood tests.     Hypothyroidism  Hypothyroidism is when the thyroid gland does not make enough of certain hormones (it is underactive). The thyroid gland is a small gland located in the lower front part of the neck, just in front of the windpipe (trachea). This gland makes hormones that help control how the body uses food for energy (metabolism) as well as how the heart and brain function. These hormones also play a role in keeping your bones strong. When the thyroid is underactive, it produces too little of the hormones thyroxine (T4) and triiodothyronine (T3). What are the causes? This condition may be caused by:  Hashimoto's disease. This is a disease in which the body's disease-fighting system (immune system) attacks the thyroid gland. This is the most common cause.  Viral infections.  Pregnancy.  Certain medicines.  Birth  defects.  Past radiation treatments to the head or neck for cancer.  Past treatment with radioactive iodine.  Past exposure to radiation in the environment.  Past surgical removal of part or all of the thyroid.  Problems with a gland in the center of the brain (pituitary gland).  Lack of enough iodine in the diet. What increases the risk? You are more likely to develop this condition if:  You are female.  You have a family history of thyroid conditions.  You use a medicine called lithium.  You take medicines that affect the immune system (immunosuppressants). What are  the signs or symptoms? Symptoms of this condition include:  Feeling as though you have no energy (lethargy).  Not being able to tolerate cold.  Weight gain that is not explained by a change in diet or exercise habits.  Lack of appetite.  Dry skin.  Coarse hair.  Menstrual irregularity.  Slowing of thought processes.  Constipation.  Sadness or depression. How is this diagnosed? This condition may be diagnosed based on:  Your symptoms, your medical history, and a physical exam.  Blood tests. You may also have imaging tests, such as an ultrasound or MRI. How is this treated? This condition is treated with medicine that replaces the thyroid hormones that your body does not make. After you begin treatment, it may take several weeks for symptoms to go away. Follow these instructions at home:  Take over-the-counter and prescription medicines only as told by your health care provider.  If you start taking any new medicines, tell your health care provider.  Keep all follow-up visits as told by your health care provider. This is important. ? As your condition improves, your dosage of thyroid hormone medicine may change. ? You will need to have blood tests regularly so that your health care provider can monitor your condition. Contact a health care provider if:  Your symptoms do not get better with treatment.  You are taking thyroid replacement medicine and you: ? Sweat a lot. ? Have tremors. ? Feel anxious. ? Lose weight rapidly. ? Cannot tolerate heat. ? Have emotional swings. ? Have diarrhea. ? Feel weak. Get help right away if you have:  Chest pain.  An irregular heartbeat.  A rapid heartbeat.  Difficulty breathing. Summary  Hypothyroidism is when the thyroid gland does not make enough of certain hormones (it is underactive).  When the thyroid is underactive, it produces too little of the hormones thyroxine (T4) and triiodothyronine (T3).  The most common  cause is Hashimoto's disease, a disease in which the body's disease-fighting system (immune system) attacks the thyroid gland. The condition can also be caused by viral infections, medicine, pregnancy, or past radiation treatment to the head or neck.  Symptoms may include weight gain, dry skin, constipation, feeling as though you do not have energy, and not being able to tolerate cold.  This condition is treated with medicine to replace the thyroid hormones that your body does not make. This information is not intended to replace advice given to you by your health care provider. Make sure you discuss any questions you have with your health care provider. Document Released: 08/27/2005 Document Revised: 08/07/2017 Document Reviewed: 08/07/2017 Elsevier Interactive Patient Education  2019 [employer].

## 2018-10-22 NOTE — Patient Instructions (Addendum)
Blood tests are requested for you today.  We'll let you know about the results.  Our goal is normal blood tests.     Hypothyroidism  Hypothyroidism is when the thyroid gland does not make enough of certain hormones (it is underactive). The thyroid gland is a small gland located in the lower front part of the neck, just in front of the windpipe (trachea). This gland makes hormones that help control how the body uses food for energy (metabolism) as well as how the heart and brain function. These hormones also play a role in keeping your bones strong. When the thyroid is underactive, it produces too little of the hormones thyroxine (T4) and triiodothyronine (T3). What are the causes? This condition may be caused by:  Hashimoto's disease. This is a disease in which the body's disease-fighting system (immune system) attacks the thyroid gland. This is the most common cause.  Viral infections.  Pregnancy.  Certain medicines.  Birth defects.  Past radiation treatments to the head or neck for cancer.  Past treatment with radioactive iodine.  Past exposure to radiation in the environment.  Past surgical removal of part or all of the thyroid.  Problems with a gland in the center of the brain (pituitary gland).  Lack of enough iodine in the diet. What increases the risk? You are more likely to develop this condition if:  You are female.  You have a family history of thyroid conditions.  You use a medicine called lithium.  You take medicines that affect the immune system (immunosuppressants). What are the signs or symptoms? Symptoms of this condition include:  Feeling as though you have no energy (lethargy).  Not being able to tolerate cold.  Weight gain that is not explained by a change in diet or exercise habits.  Lack of appetite.  Dry skin.  Coarse hair.  Menstrual irregularity.  Slowing of thought processes.  Constipation.  Sadness or depression. How is this  diagnosed? This condition may be diagnosed based on:  Your symptoms, your medical history, and a physical exam.  Blood tests. You may also have imaging tests, such as an ultrasound or MRI. How is this treated? This condition is treated with medicine that replaces the thyroid hormones that your body does not make. After you begin treatment, it may take several weeks for symptoms to go away. Follow these instructions at home:  Take over-the-counter and prescription medicines only as told by your health care provider.  If you start taking any new medicines, tell your health care provider.  Keep all follow-up visits as told by your health care provider. This is important. ? As your condition improves, your dosage of thyroid hormone medicine may change. ? You will need to have blood tests regularly so that your health care provider can monitor your condition. Contact a health care provider if:  Your symptoms do not get better with treatment.  You are taking thyroid replacement medicine and you: ? Sweat a lot. ? Have tremors. ? Feel anxious. ? Lose weight rapidly. ? Cannot tolerate heat. ? Have emotional swings. ? Have diarrhea. ? Feel weak. Get help right away if you have:  Chest pain.  An irregular heartbeat.  A rapid heartbeat.  Difficulty breathing. Summary  Hypothyroidism is when the thyroid gland does not make enough of certain hormones (it is underactive).  When the thyroid is underactive, it produces too little of the hormones thyroxine (T4) and triiodothyronine (T3).  The most common cause is Hashimoto's disease, a  disease in which the body's disease-fighting system (immune system) attacks the thyroid gland. The condition can also be caused by viral infections, medicine, pregnancy, or past radiation treatment to the head or neck.  Symptoms may include weight gain, dry skin, constipation, feeling as though you do not have energy, and not being able to tolerate  cold.  This condition is treated with medicine to replace the thyroid hormones that your body does not make. This information is not intended to replace advice given to you by your health care provider. Make sure you discuss any questions you have with your health care provider. Document Released: 08/27/2005 Document Revised: 08/07/2017 Document Reviewed: 08/07/2017 Elsevier Interactive Patient Education  2019 [employer].

## 2018-10-27 ENCOUNTER — Telehealth: Payer: Self-pay | Admitting: Gastroenterology

## 2018-10-27 NOTE — Telephone Encounter (Signed)
Please see previous message and advise on patho results

## 2018-10-29 ENCOUNTER — Encounter: Payer: Self-pay | Admitting: Gastroenterology

## 2018-11-10 ENCOUNTER — Other Ambulatory Visit: Payer: Self-pay | Admitting: Allergy

## 2018-11-11 ENCOUNTER — Ambulatory Visit: Payer: Self-pay | Admitting: Gastroenterology

## 2018-11-11 ENCOUNTER — Ambulatory Visit: Payer: Medicare Other | Admitting: Allergy

## 2018-12-05 ENCOUNTER — Other Ambulatory Visit: Payer: Self-pay | Admitting: Allergy

## 2018-12-30 DIAGNOSIS — J309 Allergic rhinitis, unspecified: Secondary | ICD-10-CM | POA: Diagnosis not present

## 2018-12-30 DIAGNOSIS — J310 Chronic rhinitis: Secondary | ICD-10-CM | POA: Diagnosis not present

## 2018-12-30 DIAGNOSIS — K219 Gastro-esophageal reflux disease without esophagitis: Secondary | ICD-10-CM | POA: Diagnosis not present

## 2018-12-30 DIAGNOSIS — M159 Polyosteoarthritis, unspecified: Secondary | ICD-10-CM | POA: Diagnosis not present

## 2019-01-14 DIAGNOSIS — E669 Obesity, unspecified: Secondary | ICD-10-CM | POA: Diagnosis not present

## 2019-01-14 DIAGNOSIS — Z0000 Encounter for general adult medical examination without abnormal findings: Secondary | ICD-10-CM | POA: Diagnosis not present

## 2019-01-14 DIAGNOSIS — Z1331 Encounter for screening for depression: Secondary | ICD-10-CM | POA: Diagnosis not present

## 2019-01-14 DIAGNOSIS — Z9181 History of falling: Secondary | ICD-10-CM | POA: Diagnosis not present

## 2019-01-14 DIAGNOSIS — E785 Hyperlipidemia, unspecified: Secondary | ICD-10-CM | POA: Diagnosis not present

## 2019-01-26 ENCOUNTER — Other Ambulatory Visit: Payer: Self-pay | Admitting: Allergy and Immunology

## 2019-01-29 DIAGNOSIS — K219 Gastro-esophageal reflux disease without esophagitis: Secondary | ICD-10-CM | POA: Diagnosis not present

## 2019-01-29 DIAGNOSIS — J0100 Acute maxillary sinusitis, unspecified: Secondary | ICD-10-CM | POA: Diagnosis not present

## 2019-01-29 DIAGNOSIS — J309 Allergic rhinitis, unspecified: Secondary | ICD-10-CM | POA: Diagnosis not present

## 2019-01-29 DIAGNOSIS — M159 Polyosteoarthritis, unspecified: Secondary | ICD-10-CM | POA: Diagnosis not present

## 2019-02-26 DIAGNOSIS — M159 Polyosteoarthritis, unspecified: Secondary | ICD-10-CM | POA: Diagnosis not present

## 2019-02-26 DIAGNOSIS — J309 Allergic rhinitis, unspecified: Secondary | ICD-10-CM | POA: Diagnosis not present

## 2019-02-26 DIAGNOSIS — J028 Acute pharyngitis due to other specified organisms: Secondary | ICD-10-CM | POA: Diagnosis not present

## 2019-02-26 DIAGNOSIS — B9689 Other specified bacterial agents as the cause of diseases classified elsewhere: Secondary | ICD-10-CM | POA: Diagnosis not present

## 2019-03-26 DIAGNOSIS — J310 Chronic rhinitis: Secondary | ICD-10-CM | POA: Diagnosis not present

## 2019-03-26 DIAGNOSIS — K219 Gastro-esophageal reflux disease without esophagitis: Secondary | ICD-10-CM | POA: Diagnosis not present

## 2019-03-26 DIAGNOSIS — J309 Allergic rhinitis, unspecified: Secondary | ICD-10-CM | POA: Diagnosis not present

## 2019-03-26 DIAGNOSIS — M159 Polyosteoarthritis, unspecified: Secondary | ICD-10-CM | POA: Diagnosis not present

## 2019-04-14 DIAGNOSIS — H04123 Dry eye syndrome of bilateral lacrimal glands: Secondary | ICD-10-CM | POA: Diagnosis not present

## 2019-04-23 DIAGNOSIS — M159 Polyosteoarthritis, unspecified: Secondary | ICD-10-CM | POA: Diagnosis not present

## 2019-04-23 DIAGNOSIS — J309 Allergic rhinitis, unspecified: Secondary | ICD-10-CM | POA: Diagnosis not present

## 2019-04-23 DIAGNOSIS — J310 Chronic rhinitis: Secondary | ICD-10-CM | POA: Diagnosis not present

## 2019-04-23 DIAGNOSIS — K219 Gastro-esophageal reflux disease without esophagitis: Secondary | ICD-10-CM | POA: Diagnosis not present

## 2019-05-21 DIAGNOSIS — M159 Polyosteoarthritis, unspecified: Secondary | ICD-10-CM | POA: Diagnosis not present

## 2019-05-21 DIAGNOSIS — J310 Chronic rhinitis: Secondary | ICD-10-CM | POA: Diagnosis not present

## 2019-05-21 DIAGNOSIS — J309 Allergic rhinitis, unspecified: Secondary | ICD-10-CM | POA: Diagnosis not present

## 2019-05-21 DIAGNOSIS — K219 Gastro-esophageal reflux disease without esophagitis: Secondary | ICD-10-CM | POA: Diagnosis not present

## 2019-05-22 DIAGNOSIS — H26491 Other secondary cataract, right eye: Secondary | ICD-10-CM | POA: Diagnosis not present

## 2019-06-02 ENCOUNTER — Other Ambulatory Visit: Payer: Self-pay

## 2019-06-02 ENCOUNTER — Encounter: Payer: Self-pay | Admitting: Allergy

## 2019-06-02 ENCOUNTER — Ambulatory Visit (INDEPENDENT_AMBULATORY_CARE_PROVIDER_SITE_OTHER): Payer: Medicare Other | Admitting: Allergy

## 2019-06-02 VITALS — BP 132/72 | HR 68 | Temp 97.3°F | Resp 16 | Ht 62.2 in | Wt 174.4 lb

## 2019-06-02 DIAGNOSIS — J3089 Other allergic rhinitis: Secondary | ICD-10-CM | POA: Diagnosis not present

## 2019-06-02 DIAGNOSIS — J4541 Moderate persistent asthma with (acute) exacerbation: Secondary | ICD-10-CM

## 2019-06-02 NOTE — Progress Notes (Signed)
Follow-up Note  RE: Brandy Torres MRN: [id number] DOB: 15-Jan-1946 Date of Office Visit: 06/02/2019   History of present illness: Brandy Torres is a 73 y.o. female presenting today for follow-up of allergic asthma and rhinitis.  She was last seen in the office on 08/12/18 by myself at which time she was treated for sinusitis with asthma exacerbation with antibiotic and prednisone.   She stopped singulair as she thought it was causing tingling in her mouth.  She had a sinus infection in May/June and had prednisone and doxycyline course at that time and had worsening of the tingling and thus decided to stop the singulair.   She reports she only had 2 singulair tabs left thus she never refilled.  She states the tingling sensation did subside.   She states with fall arriving she is having scratchy throat and HA and congestion.  She states taking zyrtec-D for the past month.   She states she found mold in her home recently.   She ran out of [place] and is using flonase now and Astelin.  She denies any respiratory symptoms at this time and states has not needed to use any of her xopenex.  She states she has not used Symbicort over the past month.    Review of systems: Review of Systems  Constitutional: Negative for chills, fever and malaise/fatigue.  HENT: Positive for congestion and sore throat. Negative for ear discharge, ear pain and nosebleeds.   Eyes: Negative for pain, discharge and redness.  Respiratory: Negative.   Cardiovascular: Negative.   Gastrointestinal: Negative.   Musculoskeletal: Negative.   Skin: Negative for itching and rash.  Neurological: Positive for dizziness and headaches.    All other systems negative unless noted above in HPI  Past medical/social/surgical/family history have been reviewed and are unchanged unless specifically indicated below.  No changes  Medication List: Allergies as of 06/02/2019      Reactions   Avelox [moxifloxacin Hcl In Nacl] Swelling     Throat swelling.   Benzoxonium Chloride [benzoxonium]    Eyes red, pain, itching   Celebrex [celecoxib] Other (See Comments)   Throat, mouth thickness   Dexilant [dexlansoprazole] Other (See Comments)   Colitis   Penicillins Other (See Comments)   Itching at injection site   Prevacid [lansoprazole] Other (See Comments)   Colitis      Medication List       Accurate as of June 02, 2019 12:32 PM. If you have any questions, ask your nurse or doctor.        STOP taking these medications   alendronate 70 MG tablet Commonly known as: FOSAMAX Stopped by: Charles Niese Charmian Muff, MD   aspirin EC 81 MG tablet Stopped by: Beck Cofer Charmian Muff, MD   dexlansoprazole 60 MG capsule Commonly known as: Dexilant Stopped by: Merari Pion Charmian Muff, MD   GAS-X PO Stopped by: Sahirah Rudell Charmian Muff, MD   montelukast 10 MG tablet Commonly known as: SINGULAIR Stopped by: Laith Antonelli Charmian Muff, MD   ROLAIDS PO Stopped by: Spenser Cong Charmian Muff, MD   TUMS PO Stopped by: Mihir Flanigan Charmian Muff, MD     TAKE these medications   amLODipine 5 MG tablet Commonly known as: NORVASC Take 5 mg by mouth daily.   azelastine 0.1 % nasal spray Commonly known as: ASTELIN Use two sprays in each nostril twice daily as directed   budesonide-formoterol 160-4.5 MCG/ACT inhaler Commonly known as: Symbicort Inhale two puffs twice daily to prevent cough or wheeze.  Rinse, gargle, and spit after use.   buPROPion 300 MG 24 hr tablet Commonly known as: WELLBUTRIN XL Take 300 mg by mouth daily.   Calcium 600+D3 600-800 MG-UNIT Tabs Generic drug: Calcium Carb-Cholecalciferol Take 1 tablet by mouth 2 (two) times daily.   escitalopram 20 MG tablet Commonly known as: LEXAPRO Take 20 mg by mouth daily.   esomeprazole 40 MG capsule Commonly known as: NEXIUM Take 40 mg by mouth daily at 12 noon. What changed: Another medication with the same name was removed. Continue taking  this medication, and follow the directions you see here. Changed by: Lashawndra Lampkins Charmian Muff, MD   FIBER PO Take by mouth as needed.   GLUCOSAMINE PO Take by mouth 2 (two) times daily.   levalbuterol 45 MCG/ACT inhaler Commonly known as: XOPENEX HFA INHALE 2 PUFFS BY MOUTH EVERY 4 TO 6 HOURS AS NEEDED FOR COUGH FOR WHEEZE   levothyroxine 175 MCG tablet Commonly known as: SYNTHROID Take 175 mcg by mouth daily. What changed: Another medication with the same name was removed. Continue taking this medication, and follow the directions you see here. Changed by: Jett Fukuda Charmian Muff, MD   MSM 1500 MG Tabs Take by mouth 2 (two) times daily.   MULTIVITAMIN ADULT PO Take by mouth daily.   rosuvastatin 20 MG tablet Commonly known as: CRESTOR Take 20 mg by mouth daily.   TYLENOL PO Take 650 mg by mouth as needed.   VEGETABLE LAXATIVE PO Take by mouth as needed.   XHANCE NA Place 2 sprays into the nose 2 (two) times daily.       Known medication allergies: Allergies  Allergen Reactions   Avelox [Moxifloxacin Hcl In Nacl] Swelling    Throat swelling.   Benzoxonium Chloride [Benzoxonium]     Eyes red, pain, itching   Celebrex [Celecoxib] Other (See Comments)    Throat, mouth thickness   Dexilant [Dexlansoprazole] Other (See Comments)    Colitis   Penicillins Other (See Comments)    Itching at injection site   Prevacid [Lansoprazole] Other (See Comments)    Colitis      Physical examination: Blood pressure 132/72, pulse 68, temperature (!) 97.3 F (36.3 C), temperature source Temporal, resp. rate 16, height 5' 2.2" (1.58 m), weight 174 lb 6.4 oz (79.1 kg), SpO2 98 %.  General: Alert, interactive, in no acute distress. HEENT: PERRLA, TMs pearly gray, turbinates mildly edematous without discharge, post-pharynx non erythematous. Neck: Supple without lymphadenopathy. Lungs: Clear to auscultation without wheezing, rhonchi or rales. {no increased work of  breathing. CV: Normal S1, S2 without murmurs. Abdomen: Nondistended, nontender. Skin: Warm and dry, without lesions or rashes. Extremities:  No clubbing, cyanosis or edema. Neuro:   Grossly intact.  Diagnositics/Labs:  Spirometry: FEV1: 1.52L 78%, FVC: 2.00L 77%, ratio consistent with nonobstrutive pattern for age.  Stable study  Assessment and plan:   Environmental allergy   - continue avoidance measures for molds.     - recommend air purifier with HEPA filtration system in the home.  Also recommend mold remediation if indeed mold in home vs mildew.   - for nasal congestion use Xhance 2 sprays 2 times a day.  Samples boxes provided today    - for nasal drainage/sore throat/throat clearing continue use nasal antihistamine, Astelin, 2 sprays each nostril twice a day.    - can use Zyrtec-D for severe nasal congestion, HA symptoms for short periods times (days to 1 week).    Otherwise recommend daily use of Allegra 180mg   daily    - recommend using a face mask while performing plant care  Asthma, allergic   - stable   - have access to Xopenex inhaler 2 puffs every 4-6 hours as needed for cough/wheeze/shortness of breath/chest tightness.  May use 15-20 minutes prior to activity.   Monitor frequency of use.      - resume Symbicort 138mcg 2 puffs twice a day at this time.   Control goals:   Full participation in all desired activities (may need albuterol before activity)  Albuterol use two time or less a week on average (not counting use with activity)  Cough interfering with sleep two time or less a month  Oral steroids no more than once a year  No hospitalizations  Follow-up 4-6 months or sooner if needed  I appreciate the opportunity to take part in Kentrell's care. Please do not hesitate to contact me with questions.  Sincerely,   Prudy Feeler, MD Allergy/Immunology [facility] [place] [facility]

## 2019-06-02 NOTE — Patient Instructions (Addendum)
Environmental allergy   - continue avoidance measures for molds.     - recommend air purifier with HEPA filtration system in the home   - for nasal congestion use Xhance 2 sprays 2 times a day.  Samples boxes provided today    - for nasal drainage/sore throat/throat clearing continue use nasal antihistamine, Astelin, 2 sprays each nostril twice a day.    - can use Zyrtec-D for severe nasal congestion, HA symptoms for short periods times (days to 1 week).    Otherwise recommend daily use of Allegra 180mg  daily    - recommend using a face mask while performing plant care  Asthma, allergic   - stable   - have access to Xopenex inhaler 2 puffs every 4-6 hours as needed for cough/wheeze/shortness of breath/chest tightness.  May use 15-20 minutes prior to activity.   Monitor frequency of use.      - resume Symbicort 151mcg 2 puffs twice a day at this time.   Control goals:   Full participation in all desired activities (may need albuterol before activity)  Albuterol use two time or less a week on average (not counting use with activity)  Cough interfering with sleep two time or less a month  Oral steroids no more than once a year  No hospitalizations  Follow-up 4-6 months or sooner if needed

## 2019-06-04 DIAGNOSIS — M659 Synovitis and tenosynovitis, unspecified: Secondary | ICD-10-CM | POA: Diagnosis not present

## 2019-06-04 DIAGNOSIS — E7800 Pure hypercholesterolemia, unspecified: Secondary | ICD-10-CM | POA: Diagnosis not present

## 2019-06-04 DIAGNOSIS — I10 Essential (primary) hypertension: Secondary | ICD-10-CM | POA: Diagnosis not present

## 2019-06-04 DIAGNOSIS — E039 Hypothyroidism, unspecified: Secondary | ICD-10-CM | POA: Diagnosis not present

## 2019-06-04 DIAGNOSIS — M159 Polyosteoarthritis, unspecified: Secondary | ICD-10-CM | POA: Diagnosis not present

## 2019-06-04 DIAGNOSIS — Z23 Encounter for immunization: Secondary | ICD-10-CM | POA: Diagnosis not present

## 2019-06-04 DIAGNOSIS — J309 Allergic rhinitis, unspecified: Secondary | ICD-10-CM | POA: Diagnosis not present

## 2019-06-04 DIAGNOSIS — D464 Refractory anemia, unspecified: Secondary | ICD-10-CM | POA: Diagnosis not present

## 2019-06-04 DIAGNOSIS — K219 Gastro-esophageal reflux disease without esophagitis: Secondary | ICD-10-CM | POA: Diagnosis not present

## 2019-06-18 DIAGNOSIS — J309 Allergic rhinitis, unspecified: Secondary | ICD-10-CM | POA: Diagnosis not present

## 2019-06-18 DIAGNOSIS — K219 Gastro-esophageal reflux disease without esophagitis: Secondary | ICD-10-CM | POA: Diagnosis not present

## 2019-06-18 DIAGNOSIS — M659 Synovitis and tenosynovitis, unspecified: Secondary | ICD-10-CM | POA: Diagnosis not present

## 2019-06-18 DIAGNOSIS — Z139 Encounter for screening, unspecified: Secondary | ICD-10-CM | POA: Diagnosis not present

## 2019-06-18 DIAGNOSIS — M159 Polyosteoarthritis, unspecified: Secondary | ICD-10-CM | POA: Diagnosis not present

## 2019-07-07 DIAGNOSIS — M659 Synovitis and tenosynovitis, unspecified: Secondary | ICD-10-CM | POA: Diagnosis not present

## 2019-07-07 DIAGNOSIS — J309 Allergic rhinitis, unspecified: Secondary | ICD-10-CM | POA: Diagnosis not present

## 2019-07-07 DIAGNOSIS — K219 Gastro-esophageal reflux disease without esophagitis: Secondary | ICD-10-CM | POA: Diagnosis not present

## 2019-07-07 DIAGNOSIS — M159 Polyosteoarthritis, unspecified: Secondary | ICD-10-CM | POA: Diagnosis not present

## 2019-07-13 NOTE — Telephone Encounter (Signed)
.  A user error has taken place: error

## 2019-07-21 DIAGNOSIS — M159 Polyosteoarthritis, unspecified: Secondary | ICD-10-CM | POA: Diagnosis not present

## 2019-07-21 DIAGNOSIS — J309 Allergic rhinitis, unspecified: Secondary | ICD-10-CM | POA: Diagnosis not present

## 2019-07-21 DIAGNOSIS — K219 Gastro-esophageal reflux disease without esophagitis: Secondary | ICD-10-CM | POA: Diagnosis not present

## 2019-07-21 DIAGNOSIS — M659 Synovitis and tenosynovitis, unspecified: Secondary | ICD-10-CM | POA: Diagnosis not present

## 2019-08-10 ENCOUNTER — Other Ambulatory Visit: Payer: Self-pay | Admitting: Allergy

## 2019-08-24 DIAGNOSIS — J309 Allergic rhinitis, unspecified: Secondary | ICD-10-CM | POA: Diagnosis not present

## 2019-08-24 DIAGNOSIS — R6889 Other general symptoms and signs: Secondary | ICD-10-CM | POA: Diagnosis not present

## 2019-08-24 DIAGNOSIS — Z20828 Contact with and (suspected) exposure to other viral communicable diseases: Secondary | ICD-10-CM | POA: Diagnosis not present

## 2019-08-24 DIAGNOSIS — K219 Gastro-esophageal reflux disease without esophagitis: Secondary | ICD-10-CM | POA: Diagnosis not present

## 2019-08-25 DIAGNOSIS — E669 Obesity, unspecified: Secondary | ICD-10-CM | POA: Diagnosis not present

## 2019-08-25 DIAGNOSIS — J208 Acute bronchitis due to other specified organisms: Secondary | ICD-10-CM | POA: Diagnosis not present

## 2019-08-25 DIAGNOSIS — Z6830 Body mass index (BMI) 30.0-30.9, adult: Secondary | ICD-10-CM | POA: Diagnosis not present

## 2019-08-25 DIAGNOSIS — K219 Gastro-esophageal reflux disease without esophagitis: Secondary | ICD-10-CM | POA: Diagnosis not present

## 2019-08-25 DIAGNOSIS — J309 Allergic rhinitis, unspecified: Secondary | ICD-10-CM | POA: Diagnosis not present

## 2019-08-25 DIAGNOSIS — B9689 Other specified bacterial agents as the cause of diseases classified elsewhere: Secondary | ICD-10-CM | POA: Diagnosis not present

## 2019-09-15 ENCOUNTER — Ambulatory Visit (INDEPENDENT_AMBULATORY_CARE_PROVIDER_SITE_OTHER): Payer: Medicare Other | Admitting: Allergy

## 2019-09-15 ENCOUNTER — Encounter: Payer: Self-pay | Admitting: Allergy

## 2019-09-15 ENCOUNTER — Other Ambulatory Visit: Payer: Self-pay

## 2019-09-15 VITALS — BP 134/84 | HR 76 | Temp 98.7°F | Resp 16

## 2019-09-15 DIAGNOSIS — J3089 Other allergic rhinitis: Secondary | ICD-10-CM

## 2019-09-15 DIAGNOSIS — J4541 Moderate persistent asthma with (acute) exacerbation: Secondary | ICD-10-CM | POA: Diagnosis not present

## 2019-09-15 NOTE — Patient Instructions (Addendum)
Environmental allergy   - continue avoidance measures for molds.     - recommend wearing a mask (preferably that seals around nose and mouth) for yardwark or plant care.   - for nasal congestion use Xhance 2 sprays 2 times a day.  Sample provided today.     - for nasal drainage/sore throat/throat clearing continue use nasal antihistamine, Astelin, 2 sprays each nostril twice a day.   - recommend use of nasal saline rinse (ie. Netipot) to help flush/clean out the sinuses.  Can perform daily as needed.  Use with distilled water or boil water and bring down to room temperature before use.  Breathe through your mouth while perform saline rinse.      - can use Zyrtec-D for severe nasal congestion, HA symptoms for short periods times (days to 1 week).    Otherwise recommend daily use of Allegra 180mg  daily    - resume Singulair 10mg  daily at bedtime.  Let us know if you develop any mouth symptoms while taking  Asthma, allergic   - stable   - have access to Xopenex inhaler 2 puffs every 4-6 hours as needed for cough/wheeze/shortness of breath/chest tightness.  May use 15-20 minutes prior to activity.   Monitor frequency of use.      - use Symbicort 151mcg 2 puffs twice a day during respiratory illnesses or flare-ups  Control goals:   Full participation in all desired activities (may need albuterol before activity)  Albuterol use two time or less a week on average (not counting use with activity)  Cough interfering with sleep two time or less a month  Oral steroids no more than once a year  No hospitalizations  Follow-up 4-6 months or sooner if needed

## 2019-09-15 NOTE — Progress Notes (Signed)
Follow-up Note  RE: Brandy Torres MRN: [id number] DOB: 16-Jan-1946 Date of Office Visit: 09/15/2019   History of present illness: Brandy Torres is a 74 y.o. female presenting today for follow-up of allergic rhinitis and asthma.  She was last seen in the office on 06/02/2019 by myself.  She reports she had sinus congestion with headache, sore throat and low grade fever (99) around the Christmas holiday.  She called her PCP and she was tested for Covid and flu which were both negative.  She was treated with course of doxycycline and prednisone taper.  She finished the doxycycline on Christmas day.  She states about 7 days into the course her mouth started to bother her with tongue sensitivity, lip burning and tongue tingling sensation.  She states this was the same oral symptoms she had when she had a doxycycline course over the summer.  At that time she thought it was related to the Singulair and thus we stopped the Singulair.  However with the doxycycline course again with similar oral symptoms she now thinks that the mouth symptoms are related to the doxycycline and not the Singulair.  She would like to retry Singulair again at this time as she did find that nighttime symptoms were much improved and she had less drainage when she was on Singulair. She states despite doing the antibiotic and prednisone course but she is still having some nasal drainage and needing to throat clear a lot.  She is using her Astelin twice a day however still with drainage.  She states has run out of [place] and would like another sample is available.  She has tried using ZyrtecD but doesn't seem to be helping as much as previous usages. Denies using xopenex however has used Symbicort some mornings for feeling "breathless".  Denies nighttime awakenings.  No ED/UC visits.     Review of systems: Review of Systems  HENT: Positive for congestion and sore throat.   Eyes: Negative for pain, discharge and redness.    Respiratory: Negative.   Cardiovascular: Negative.   Gastrointestinal: Negative.   Musculoskeletal: Negative.   Skin: Negative.   Neurological: Positive for headaches.    All other systems negative unless noted above in HPI  Past medical/social/surgical/family history have been reviewed and are unchanged unless specifically indicated below.  No changes  Medication List: Current Outpatient Medications  Medication Sig Dispense Refill  . Acetaminophen (TYLENOL PO) Take 650 mg by mouth as needed.    Marland Kitchen amLODipine (NORVASC) 5 MG tablet Take 5 mg by mouth daily.   5  . Azelastine HCl 137 MCG/SPRAY SOLN USE 2 SPRAY(S) IN EACH NOSTRIL TWICE DAILY AS DIRECTED 30 mL 0  . budesonide-formoterol (SYMBICORT) 160-4.5 MCG/ACT inhaler INHALE 2 PUFFS BY MOUTH TWICE DAILY TO PREVENT COUGH OR WHEEZE. RINSE, GARGLE AND SPIT AFTER USE 33 g 0  . buPROPion (WELLBUTRIN XL) 300 MG 24 hr tablet Take 300 mg by mouth daily.  5  . Calcium Carb-Cholecalciferol (CALCIUM 600+D3) 600-800 MG-UNIT TABS Take 1 tablet by mouth 2 (two) times daily.    Marland Kitchen escitalopram (LEXAPRO) 20 MG tablet Take 20 mg by mouth daily.   5  . esomeprazole (NEXIUM) 40 MG capsule Take 40 mg by mouth daily at 12 noon.    Marland Kitchen FIBER PO Take by mouth as needed.    . Fluticasone Propionate (XHANCE NA) Place 2 sprays into the nose 2 (two) times daily.    Marland Kitchen levalbuterol (XOPENEX HFA) 45 MCG/ACT inhaler INHALE  2 PUFFS BY MOUTH EVERY 4 TO 6 HOURS AS NEEDED FOR COUGH FOR WHEEZING 15 g 0  . levothyroxine (SYNTHROID) 175 MCG tablet Take 175 mcg by mouth daily.    . Methylsulfonylmethane (MSM) 1500 MG TABS Take by mouth 2 (two) times daily.    . Multiple Vitamins-Minerals (MULTIVITAMIN ADULT PO) Take by mouth daily.    . Psyllium (VEGETABLE LAXATIVE PO) Take by mouth as needed.    . rosuvastatin (CRESTOR) 20 MG tablet Take 20 mg by mouth daily.   3   No current facility-administered medications for this visit.     Known medication allergies: Allergies   Allergen Reactions  . Avelox [Moxifloxacin Hcl In Nacl] Swelling    Throat swelling.  . Benzoxonium Chloride [Benzoxonium]     Eyes red, pain, itching  . Celebrex [Celecoxib] Other (See Comments)    Throat, mouth thickness  . Dexilant [Dexlansoprazole] Other (See Comments)    Colitis  . Doxycycline Other (See Comments)    Mouth tingling Lip burning   . Penicillins Other (See Comments)    Itching at injection site  . Prevacid [Lansoprazole] Other (See Comments)    Colitis      Physical examination: Blood pressure 134/84, pulse 76, temperature 98.7 F (37.1 C), temperature source Temporal, resp. rate 16, SpO2 99 %.  General: Alert, interactive, in no acute distress. HEENT: PERRLA, TMs pearly gray, turbinates mildly edematous with clear discharge, post-pharynx non erythematous. Neck: Supple without lymphadenopathy. Lungs: Clear to auscultation without wheezing, rhonchi or rales. {no increased work of breathing. CV: Normal S1, S2 without murmurs. Abdomen: Nondistended, nontender. Skin: Warm and dry, without lesions or rashes. Extremities:  No clubbing, cyanosis or edema. Neuro:   Grossly intact.  Diagnositics/Labs: None today  Assessment and plan: Allergic rhinitis   - continue avoidance measures for molds.     - recommend wearing a mask (preferably that seals around nose and mouth) for yardwark or plant care.   - for nasal congestion use Xhance 2 sprays 2 times a day.  Sample provided today.     - for nasal drainage/sore throat/throat clearing continue use nasal antihistamine, Astelin, 2 sprays each nostril twice a day.   - recommend use of nasal saline rinse (ie. Netipot) to help flush/clean out the sinuses.  Can perform daily as needed.  Use with distilled water or boil water and bring down to room temperature before use.  Breathe through your mouth while perform saline rinse.      - can use Zyrtec-D for severe nasal congestion, HA symptoms for short periods times (days  to 1 week).    Otherwise recommend daily use of Allegra 180mg  daily    - resume Singulair 10mg  daily at bedtime.  Let us know if you develop any mouth symptoms while taking  Asthma, allergic   - stable   - have access to Xopenex inhaler 2 puffs every 4-6 hours as needed for cough/wheeze/shortness of breath/chest tightness.  May use 15-20 minutes prior to activity.   Monitor frequency of use.      - use Symbicort 170mcg 2 puffs twice a day during respiratory illnesses or flare-ups  Control goals:   Full participation in all desired activities (may need albuterol before activity)  Albuterol use two time or less a week on average (not counting use with activity)  Cough interfering with sleep two time or less a month  Oral steroids no more than once a year  No hospitalizations  Follow-up 4-6 months or sooner  if needed  I appreciate the opportunity to take part in Rudene's care. Please do not hesitate to contact me with questions.  Sincerely,   Prudy Feeler, MD Allergy/Immunology [facility] [place]

## 2019-09-21 ENCOUNTER — Other Ambulatory Visit: Payer: Self-pay | Admitting: Allergy and Immunology

## 2019-10-06 DIAGNOSIS — F329 Major depressive disorder, single episode, unspecified: Secondary | ICD-10-CM | POA: Diagnosis not present

## 2019-10-06 DIAGNOSIS — M159 Polyosteoarthritis, unspecified: Secondary | ICD-10-CM | POA: Diagnosis not present

## 2019-10-06 DIAGNOSIS — J309 Allergic rhinitis, unspecified: Secondary | ICD-10-CM | POA: Diagnosis not present

## 2019-10-06 DIAGNOSIS — K219 Gastro-esophageal reflux disease without esophagitis: Secondary | ICD-10-CM | POA: Diagnosis not present

## 2019-11-03 DIAGNOSIS — F329 Major depressive disorder, single episode, unspecified: Secondary | ICD-10-CM | POA: Diagnosis not present

## 2019-11-03 DIAGNOSIS — J309 Allergic rhinitis, unspecified: Secondary | ICD-10-CM | POA: Diagnosis not present

## 2019-11-03 DIAGNOSIS — K219 Gastro-esophageal reflux disease without esophagitis: Secondary | ICD-10-CM | POA: Diagnosis not present

## 2019-11-03 DIAGNOSIS — M159 Polyosteoarthritis, unspecified: Secondary | ICD-10-CM | POA: Diagnosis not present

## 2019-11-09 DIAGNOSIS — F329 Major depressive disorder, single episode, unspecified: Secondary | ICD-10-CM | POA: Diagnosis not present

## 2019-11-09 DIAGNOSIS — I10 Essential (primary) hypertension: Secondary | ICD-10-CM | POA: Diagnosis not present

## 2019-11-09 DIAGNOSIS — K219 Gastro-esophageal reflux disease without esophagitis: Secondary | ICD-10-CM | POA: Diagnosis not present

## 2019-11-09 DIAGNOSIS — M159 Polyosteoarthritis, unspecified: Secondary | ICD-10-CM | POA: Diagnosis not present

## 2019-11-09 DIAGNOSIS — D464 Refractory anemia, unspecified: Secondary | ICD-10-CM | POA: Diagnosis not present

## 2019-11-09 DIAGNOSIS — J309 Allergic rhinitis, unspecified: Secondary | ICD-10-CM | POA: Diagnosis not present

## 2019-11-09 DIAGNOSIS — E7800 Pure hypercholesterolemia, unspecified: Secondary | ICD-10-CM | POA: Diagnosis not present

## 2020-01-12 ENCOUNTER — Ambulatory Visit (INDEPENDENT_AMBULATORY_CARE_PROVIDER_SITE_OTHER): Payer: Medicare Other | Admitting: Allergy

## 2020-01-12 ENCOUNTER — Encounter: Payer: Self-pay | Admitting: Allergy

## 2020-01-12 ENCOUNTER — Other Ambulatory Visit: Payer: Self-pay

## 2020-01-12 VITALS — BP 152/64 | HR 72 | Temp 98.3°F | Resp 16

## 2020-01-12 DIAGNOSIS — J3089 Other allergic rhinitis: Secondary | ICD-10-CM

## 2020-01-12 DIAGNOSIS — J4540 Moderate persistent asthma, uncomplicated: Secondary | ICD-10-CM

## 2020-01-12 DIAGNOSIS — J321 Chronic frontal sinusitis: Secondary | ICD-10-CM | POA: Diagnosis not present

## 2020-01-12 MED ORDER — XHANCE 93 MCG/ACT NA EXHU: INHALANT_SUSPENSION | NASAL | 5 refills | Status: DC

## 2020-01-12 NOTE — Progress Notes (Signed)
Please advise [facility] crew to fax her PA request to me.

## 2020-01-12 NOTE — Progress Notes (Signed)
Please fax PA request to [facility]

## 2020-01-12 NOTE — Progress Notes (Signed)
Follow-up Note  RE: Brandy Torres MRN: [id number] DOB: Apr 22, 1946 Date of Office Visit: 01/12/2020   History of present illness: Brandy Torres is a 74 y.o. female presenting today for follow-up of allergic rhinitis and asthma. She was last seen in the office on 09/15/19 by myself.  Since last visit she has had 2 doses of Pfizer covid vaccine.   She states her beautician noted that her hairline was thinning.  She was noticing more hair loss during washes.  This started after resuming singulair after last visit.  She states she read other people comments online that singulair had caused their hair loss.  She stopped singulair in April and states she has noticed less hair loss when she washes her hair.  She states since stopping singulair she has had more headaches however and it bummed she can't continue on singulair as it was provided allergy symptom control.    She was using Truett Perna that does help with controlling sinus pressure and frontal headache symptoms but she tries to use it sparingly since it was not covered by insurance.  We have provided samples from the office.  Truett Perna does work very well for her.  She states she has run out of [place] unfortunately and is back to using OTC Flonase that she does not feel provides much benefit.  She also has been using more Zyrtec-D to help with her sinus sypmtoms off Singulair. She does continue as needed use of Astelin for nasal drainage.   She does report she usually develops around 4 sinus infections a year requiring either or both antibiotics and steroids.  She states she doesn't use xopenex much at all and if needing to use inhaler will typically reach for symbicort.  She states some activities she will have some cough or difficulty breathing like with recent yardwork.  Denies however using xopenex or symbicort more than twice a week at this time.  Has not had any ED/UC visits or any systemic steroid needs since last visit.    Review of  systems: Review of Systems  Constitutional: Negative.   HENT:       See HPI  Eyes: Negative.   Respiratory:       See HPI  Cardiovascular: Negative.   Gastrointestinal: Negative.   Musculoskeletal: Negative.   Skin: Negative.   Neurological: Positive for headaches.    All other systems negative unless noted above in HPI  Past medical/social/surgical/family history have been reviewed and are unchanged unless specifically indicated below.  No changes  Medication List: Current Outpatient Medications  Medication Sig Dispense Refill  . Acetaminophen (TYLENOL PO) Take 650 mg by mouth as needed.    Marland Kitchen amLODipine (NORVASC) 5 MG tablet Take 5 mg by mouth daily.   5  . Azelastine HCl 137 MCG/SPRAY SOLN USE 2 SPRAY(S) IN EACH NOSTRIL TWICE DAILY AS DIRECTED 30 mL 0  . budesonide-formoterol (SYMBICORT) 160-4.5 MCG/ACT inhaler INHALE 2 PUFFS BY MOUTH TWICE DAILY TO PREVENT COUGH OR WHEEZE. RINSE, GARGLE AND SPIT AFTER USE 33 g 0  . buPROPion (WELLBUTRIN XL) 300 MG 24 hr tablet Take 300 mg by mouth daily.  5  . Calcium Carb-Cholecalciferol (CALCIUM 600+D3) 600-800 MG-UNIT TABS Take 1 tablet by mouth 2 (two) times daily.    Marland Kitchen escitalopram (LEXAPRO) 20 MG tablet Take 20 mg by mouth daily.   5  . esomeprazole (NEXIUM) 40 MG capsule Take 40 mg by mouth daily at 12 noon.    Marland Kitchen FIBER PO Take  by mouth as needed.    . levalbuterol (XOPENEX HFA) 45 MCG/ACT inhaler INHALE 2 PUFFS BY MOUTH EVERY 4 TO 6 HOURS AS NEEDED FOR COUGH FOR WHEEZING 15 g 0  . levothyroxine (SYNTHROID) 175 MCG tablet Take 175 mcg by mouth daily.    . Methylsulfonylmethane (MSM) 1500 MG TABS Take by mouth 2 (two) times daily.    . Multiple Vitamins-Minerals (MULTIVITAMIN ADULT PO) Take by mouth daily.    . rosuvastatin (CRESTOR) 20 MG tablet Take 20 mg by mouth daily.   3  . Fluticasone Propionate (XHANCE NA) Place 2 sprays into the nose 2 (two) times daily.    . montelukast (SINGULAIR) 10 MG tablet TAKE 1 TABLET BY MOUTH AT  BEDTIME. LAST FILL. NEEDS APPOINTMENT. (Patient not taking: Reported on 01/12/2020) 30 tablet 5   No current facility-administered medications for this visit.     Known medication allergies: Allergies  Allergen Reactions  . Avelox [Moxifloxacin Hcl In Nacl] Swelling    Throat swelling.  . Benzoxonium Chloride [Benzoxonium]     Eyes red, pain, itching  . Celebrex [Celecoxib] Other (See Comments)    Throat, mouth thickness  . Dexilant [Dexlansoprazole] Other (See Comments)    Colitis  . Doxycycline Other (See Comments)    Mouth tingling Lip burning   . Penicillins Other (See Comments)    Itching at injection site  . Prevacid [Lansoprazole] Other (See Comments)    Colitis      Physical examination: Blood pressure (!) 152/64, pulse 72, temperature 98.3 F (36.8 C), temperature source Temporal, resp. rate 16, SpO2 95 %.  General: Alert, interactive, in no acute distress. HEENT: PERRLA, TMs pearly gray, turbinates moderately edematous without discharge, post-pharynx non erythematous. Neck: Supple without lymphadenopathy. Lungs: Clear to auscultation without wheezing, rhonchi or rales. {no increased work of breathing. CV: Normal S1, S2 without murmurs. Abdomen: Nondistended, nontender. Skin: Warm and dry, without lesions or rashes. Extremities:  No clubbing, cyanosis or edema. Neuro:   Grossly intact.  Diagnositics/Labs:  Spirometry: FEV1: 1.77L 93%, FVC: 2.25L 88%, ratio consistent with nonobstrucvtive pattern  Assessment and plan: Allergic rhinitis Chronic rhinosinusitis   - continue avoidance measures for molds.     - recommend wearing a mask (preferably that seals around nose and mouth) for yardwark or plant care.   - for nasal congestion use Xhance 2 sprays 2 times a day.  Sample provided today.    - for nasal drainage/sore throat/throat clearing continue use nasal antihistamine, Astelin, 2 sprays each nostril twice a day.   - recommend use of nasal saline rinse (ie.  Netipot) to help flush/clean out the sinuses.  Can perform daily as needed.  Use with distilled water or boil water and bring down to room temperature before use.  Breathe through your mouth while perform saline rinse.      - can use Zyrtec-D for severe nasal congestion, HA symptoms for short periods times (days to 1 week).    Otherwise recommend daily use of Allegra 180mg  daily  Asthma, allergic   - stable   - have access to Xopenex inhaler 2 puffs every 4-6 hours as needed for cough/wheeze/shortness of breath/chest tightness.  May use 15-20 minutes prior to activity.   Monitor frequency of use.      - use Symbicort 169mcg 2 puffs twice a day during respiratory illnesses or flare-ups or as needed for symptoms relief (similar to albuterol).     - if you are not meeting the below goals then recommend  taking Symbicort twice a day as a maintenance medication.    Control goals:   Full participation in all desired activities (may need albuterol before activity)  Albuterol use two time or less a week on average (not counting use with activity)  Cough interfering with sleep two time or less a month  Oral steroids no more than once a year  No hospitalizations  Follow-up 4-6 months or sooner if needed  I appreciate the opportunity to take part in Adalynne's care. Please do not hesitate to contact me with questions.  Sincerely,   Prudy Feeler, MD Allergy/Immunology [facility] [place] [facility] [place]

## 2020-01-12 NOTE — Patient Instructions (Addendum)
Environmental allergy   - continue avoidance measures for molds.     - recommend wearing a mask (preferably that seals around nose and mouth) for yardwark or plant care.   - for nasal congestion use Xhance 2 sprays 2 times a day.  Sample provided today.  Will resubmit approval to your insurance and see if we can get it covered for you.      - for nasal drainage/sore throat/throat clearing continue use nasal antihistamine, Astelin, 2 sprays each nostril twice a day.   - recommend use of nasal saline rinse (ie. Netipot) to help flush/clean out the sinuses.  Can perform daily as needed.  Use with distilled water or boil water and bring down to room temperature before use.  Breathe through your mouth while perform saline rinse.      - can use Zyrtec-D for severe nasal congestion, HA symptoms for short periods times (days to 1 week).    Otherwise recommend daily use of Allegra 180mg  daily  Asthma, allergic   - stable   - have access to Xopenex inhaler 2 puffs every 4-6 hours as needed for cough/wheeze/shortness of breath/chest tightness.  May use 15-20 minutes prior to activity.   Monitor frequency of use.      - use Symbicort 129mcg 2 puffs twice a day during respiratory illnesses or flare-ups or as needed for symptoms relief (similar to albuterol).     - if you are not meeting the below goals then recommend taking Symbicort twice a day as a maintenance medication.    Control goals:   Full participation in all desired activities (may need albuterol before activity)  Albuterol use two time or less a week on average (not counting use with activity)  Cough interfering with sleep two time or less a month  Oral steroids no more than once a year  No hospitalizations  Follow-up 4-6 months or sooner if needed

## 2020-01-19 ENCOUNTER — Telehealth: Payer: Self-pay

## 2020-01-19 NOTE — Telephone Encounter (Signed)
Prior authorization submitted for Xhance on covermymeds.

## 2020-01-20 NOTE — Telephone Encounter (Signed)
Prior Josem Kaufmann was denied. I have sent clinical information and denial information to [employer] for appeal submission letter.

## 2020-01-21 NOTE — Telephone Encounter (Signed)
Appeal submitted to insurance for review.

## 2020-01-28 NOTE — Telephone Encounter (Signed)
Pending clinical information and appeal letter review from insurance.

## 2020-02-02 NOTE — Telephone Encounter (Signed)
Approved and sent to pharmacy

## 2020-05-18 ENCOUNTER — Encounter: Payer: Self-pay | Admitting: Allergy

## 2020-05-24 DIAGNOSIS — E039 Hypothyroidism, unspecified: Secondary | ICD-10-CM | POA: Diagnosis not present

## 2020-05-24 DIAGNOSIS — J309 Allergic rhinitis, unspecified: Secondary | ICD-10-CM | POA: Diagnosis not present

## 2020-05-24 DIAGNOSIS — D72829 Elevated white blood cell count, unspecified: Secondary | ICD-10-CM | POA: Diagnosis not present

## 2020-05-24 DIAGNOSIS — J310 Chronic rhinitis: Secondary | ICD-10-CM | POA: Diagnosis not present

## 2020-05-24 DIAGNOSIS — E871 Hypo-osmolality and hyponatremia: Secondary | ICD-10-CM | POA: Diagnosis not present

## 2020-05-24 DIAGNOSIS — K589 Irritable bowel syndrome without diarrhea: Secondary | ICD-10-CM | POA: Diagnosis not present

## 2020-05-24 DIAGNOSIS — M159 Polyosteoarthritis, unspecified: Secondary | ICD-10-CM | POA: Diagnosis not present

## 2020-05-24 DIAGNOSIS — Z6831 Body mass index (BMI) 31.0-31.9, adult: Secondary | ICD-10-CM | POA: Diagnosis not present

## 2020-05-24 DIAGNOSIS — E669 Obesity, unspecified: Secondary | ICD-10-CM | POA: Diagnosis not present

## 2020-05-24 DIAGNOSIS — K219 Gastro-esophageal reflux disease without esophagitis: Secondary | ICD-10-CM | POA: Diagnosis not present

## 2020-05-24 DIAGNOSIS — M810 Age-related osteoporosis without current pathological fracture: Secondary | ICD-10-CM | POA: Diagnosis not present

## 2020-05-24 DIAGNOSIS — F329 Major depressive disorder, single episode, unspecified: Secondary | ICD-10-CM | POA: Diagnosis not present

## 2020-05-31 DIAGNOSIS — E871 Hypo-osmolality and hyponatremia: Secondary | ICD-10-CM | POA: Diagnosis not present

## 2020-05-31 DIAGNOSIS — E039 Hypothyroidism, unspecified: Secondary | ICD-10-CM | POA: Diagnosis not present

## 2020-05-31 DIAGNOSIS — K219 Gastro-esophageal reflux disease without esophagitis: Secondary | ICD-10-CM | POA: Diagnosis not present

## 2020-05-31 DIAGNOSIS — I10 Essential (primary) hypertension: Secondary | ICD-10-CM | POA: Diagnosis not present

## 2020-05-31 DIAGNOSIS — J310 Chronic rhinitis: Secondary | ICD-10-CM | POA: Diagnosis not present

## 2020-05-31 DIAGNOSIS — D72829 Elevated white blood cell count, unspecified: Secondary | ICD-10-CM | POA: Diagnosis not present

## 2020-05-31 DIAGNOSIS — K589 Irritable bowel syndrome without diarrhea: Secondary | ICD-10-CM | POA: Diagnosis not present

## 2020-05-31 DIAGNOSIS — Z9181 History of falling: Secondary | ICD-10-CM | POA: Diagnosis not present

## 2020-05-31 DIAGNOSIS — J309 Allergic rhinitis, unspecified: Secondary | ICD-10-CM | POA: Diagnosis not present

## 2020-05-31 DIAGNOSIS — M810 Age-related osteoporosis without current pathological fracture: Secondary | ICD-10-CM | POA: Diagnosis not present

## 2020-05-31 DIAGNOSIS — M159 Polyosteoarthritis, unspecified: Secondary | ICD-10-CM | POA: Diagnosis not present

## 2020-05-31 DIAGNOSIS — E7800 Pure hypercholesterolemia, unspecified: Secondary | ICD-10-CM | POA: Diagnosis not present

## 2020-06-07 ENCOUNTER — Encounter: Payer: Self-pay | Admitting: Allergy

## 2020-06-07 ENCOUNTER — Other Ambulatory Visit: Payer: Self-pay

## 2020-06-07 ENCOUNTER — Ambulatory Visit (INDEPENDENT_AMBULATORY_CARE_PROVIDER_SITE_OTHER): Payer: Medicare Other | Admitting: Allergy

## 2020-06-07 VITALS — BP 136/78 | HR 79 | Resp 18 | Ht 62.0 in | Wt 178.4 lb

## 2020-06-07 DIAGNOSIS — J4540 Moderate persistent asthma, uncomplicated: Secondary | ICD-10-CM | POA: Diagnosis not present

## 2020-06-07 DIAGNOSIS — J321 Chronic frontal sinusitis: Secondary | ICD-10-CM

## 2020-06-07 DIAGNOSIS — J3089 Other allergic rhinitis: Secondary | ICD-10-CM | POA: Diagnosis not present

## 2020-06-07 MED ORDER — AZELASTINE HCL 0.1 % NA SOLN: 2.0000 | Freq: Two times a day (BID) | NASAL | 5 refills | Status: AC

## 2020-06-07 NOTE — Patient Instructions (Signed)
Environmental allergy   - continue avoidance measures for molds.     - will obtain environmental allergy panel to determine if you have any other allergens not picked up on skin testing   - recommend wearing a mask (preferably that seals around nose and mouth) for yardwark or plant care.   - for nasal congestion use Xhance 2 sprays 2 times a day.  We will arrange for Xhance sample to be available for pick-up here in office (if you will be in [place] area can pick up from [place] office)    - for nasal drainage/sore throat/throat clearing continue use nasal antihistamine, Astelin, 2 sprays each nostril twice a day.    - can use Zyrtec-D for severe nasal congestion, HA symptoms for short periods times (days to 1 week).    Otherwise recommend daily use of Allegra 180mg  daily  - for current symptoms recommend use of prednisone 5 day burst to help improve symptoms faster while awaiting Xhance  Asthma, allergic   - stable   - have access to Xopenex inhaler 2 puffs every 4-6 hours as needed for cough/wheeze/shortness of breath/chest tightness.  May use 15-20 minutes prior to activity.   Monitor frequency of use.      - use Symbicort 113mcg 2 puffs twice a day during respiratory illnesses or flare-ups or as needed for symptoms relief (similar to albuterol).     - if you are not meeting the below goals then recommend taking Symbicort twice a day as a maintenance medication.    Control goals:   Full participation in all desired activities (may need albuterol before activity)  Albuterol use two time or less a week on average (not counting use with activity)  Cough interfering with sleep two time or less a month  Oral steroids no more than once a year  No hospitalizations  Follow-up 4-6 months or sooner if needed

## 2020-06-07 NOTE — Progress Notes (Signed)
Follow-up Note  RE: Brandy Torres MRN: [id number] DOB: 05-31-1946 Date of Office Visit: 06/07/2020   History of present illness: Brandy Torres is a 74 y.o. female presenting today for follow-up of CRS with allergic rhinitis and asthma.  She was last seen in the office on 01/12/20 by myself.  She states all summer she has been struggling with sinus symptoms.  She reports having sinus congestion, sore throat, headache, dizziness, having "fuzzy vision".  She states over the summer she ran out of Xhance samples I had provided her.  She states while her insurance approved it she had to meet her deductible and the medication was $600 and her deductible is $440 which at the time she hadn't met.  She states when he has Xhance to use her symptoms were much better controlled.  She has not had any systemic steroid or antibiotic for her symptoms up to this point.  She states she tried saline rinses but there was "no positive effect".  singulair was causing her hair to shed and after stopping singulair she states her hair is much fullre.  She is using astelin which helps her nasal drainage but she needs a refill at this time.  She is using zyrtec and mostly zyrtec-D.  She states this is the best antihistamine for her.  She will use Symbicort as needed and rarely uses albuterol.  She denies any SOB or asthma symptoms with her sinus symptoms she has been having.    She had a tick bite in June and states it was the lone star tick as he had a white dot on back.  She states she had a red area for a while and can still see where it was.  She states the tick was no engorged when it was pulled out.    Review of systems: Review of Systems  Constitutional: Negative.   HENT:       See HPI  Eyes:       See HPI  Respiratory: Negative.   Cardiovascular: Negative.   Gastrointestinal: Negative.   Musculoskeletal: Negative.   Skin: Negative.   Neurological:       See HPI    All other systems negative unless noted  above in HPI  Past medical/social/surgical/family history have been reviewed and are unchanged unless specifically indicated below.  No changes  Medication List: Current Outpatient Medications  Medication Sig Dispense Refill  . Acetaminophen (TYLENOL PO) Take 650 mg by mouth as needed.    Marland Kitchen amLODipine (NORVASC) 5 MG tablet Take 5 mg by mouth daily.   5  . budesonide-formoterol (SYMBICORT) 160-4.5 MCG/ACT inhaler INHALE 2 PUFFS BY MOUTH TWICE DAILY TO PREVENT COUGH OR WHEEZE. RINSE, GARGLE AND SPIT AFTER USE 33 g 0  . buPROPion (WELLBUTRIN XL) 150 MG 24 hr tablet Take 450 mg by mouth daily.    . Calcium Carb-Cholecalciferol (CALCIUM 600+D3) 600-800 MG-UNIT TABS Take 1 tablet by mouth 2 (two) times daily.    Marland Kitchen escitalopram (LEXAPRO) 20 MG tablet Take 20 mg by mouth daily.   5  . esomeprazole (NEXIUM) 40 MG capsule Take 40 mg by mouth daily at 12 noon.    Marland Kitchen FIBER PO Take by mouth as needed.    . levalbuterol (XOPENEX HFA) 45 MCG/ACT inhaler INHALE 2 PUFFS BY MOUTH EVERY 4 TO 6 HOURS AS NEEDED FOR COUGH FOR WHEEZING 15 g 0  . levothyroxine (SYNTHROID) 175 MCG tablet Take 175 mcg by mouth daily.    Marland Kitchen  Methylsulfonylmethane (MSM) 1500 MG TABS Take by mouth 2 (two) times daily.    . Multiple Vitamins-Minerals (MULTIVITAMIN ADULT PO) Take by mouth daily.    . rosuvastatin (CRESTOR) 20 MG tablet Take 20 mg by mouth daily.   3  . XHANCE 93 MCG/ACT EXHU Blow two sprays into each nostril twice daily as directed. 32 mL 5  . azelastine (ASTELIN) 0.1 % nasal spray Place 2 sprays into both nostrils 2 (two) times daily. Use in each nostril as directed 30 mL 5   No current facility-administered medications for this visit.     Known medication allergies: Allergies  Allergen Reactions  . Avelox [Moxifloxacin Hcl In Nacl] Swelling    Throat swelling.  . Benzoxonium Chloride [Benzoxonium]     Eyes red, pain, itching  . Celebrex [Celecoxib] Other (See Comments)    Throat, mouth thickness  . Dexilant  [Dexlansoprazole] Other (See Comments)    Colitis  . Doxycycline Other (See Comments)    Mouth tingling Lip burning   . Penicillins Other (See Comments)    Itching at injection site  . Prevacid [Lansoprazole] Other (See Comments)    Colitis      Physical examination: Blood pressure 136/78, pulse 79, resp. rate 18, height _0  (1.575 m), weight 178 lb 6.4 oz (80.9 kg), SpO2 98 %.  General: Alert, interactive, in no acute distress. HEENT: PERRLA, turbinates mildly edematous without discharge, post-pharynx non erythematous. Neck: Supple without lymphadenopathy. Lungs: Clear to auscultation without wheezing, rhonchi or rales. {no increased work of breathing. CV: Normal S1, S2 without murmurs. Abdomen: Nondistended, nontender. Skin: Warm and dry, without lesions or rashes. Extremities:  No clubbing, cyanosis or edema. Neuro:   Grossly intact.  Diagnositics/Labs: None today  Assessment and plan: Chronic rhinosinusitis Allergic rhinitis   - continue avoidance measures for molds.     - will obtain environmental allergy panel to determine if you have any other allergens not picked up on skin testing   - recommend wearing a mask (preferably that seals around nose and mouth) for yardwark or plant care.   - for nasal congestion use Xhance 2 sprays 2 times a day.  We will arrange for Xhance sample to be available for pick-up here in office (if you will be in [place] area can pick up from [place] office)    - for nasal drainage/sore throat/throat clearing continue use nasal antihistamine, Astelin, 2 sprays each nostril twice a day.    - can use Zyrtec-D for severe nasal congestion, HA symptoms for short periods times (days to 1 week).    Otherwise recommend daily use of Allegra 19m daily  - for current symptoms recommend use of prednisone 5 day burst to help improve symptoms faster while awaiting Xhance  Asthma, allergic   - stable   - have access to Xopenex inhaler 2 puffs every  4-6 hours as needed for cough/wheeze/shortness of breath/chest tightness.  May use 15-20 minutes prior to activity.   Monitor frequency of use.      - use Symbicort 1685m 2 puffs twice a day during respiratory illnesses or flare-ups or as needed for symptoms relief (similar to albuterol).     - if you are not meeting the below goals then recommend taking Symbicort twice a day as a maintenance medication.    Control goals:   Full participation in all desired activities (may need albuterol before activity)  Albuterol use two time or less a week on average (not counting use with activity)  Cough interfering with sleep two time or less a month  Oral steroids no more than once a year  No hospitalizations  Follow-up 4-6 months or sooner if needed  I appreciate the opportunity to take part in [place]'s care. Please do not hesitate to contact me with questions.  Sincerely,   Prudy Feeler, MD Allergy/Immunology [facility] [place] [facility] [place]

## 2020-06-21 ENCOUNTER — Ambulatory Visit: Payer: Medicare Other | Admitting: Allergy

## 2020-07-08 DIAGNOSIS — Z23 Encounter for immunization: Secondary | ICD-10-CM | POA: Diagnosis not present

## 2020-07-26 DIAGNOSIS — H35362 Drusen (degenerative) of macula, left eye: Secondary | ICD-10-CM | POA: Diagnosis not present

## 2020-07-26 DIAGNOSIS — Z961 Presence of intraocular lens: Secondary | ICD-10-CM | POA: Diagnosis not present

## 2020-08-30 DIAGNOSIS — E871 Hypo-osmolality and hyponatremia: Secondary | ICD-10-CM | POA: Diagnosis not present

## 2020-08-30 DIAGNOSIS — D72829 Elevated white blood cell count, unspecified: Secondary | ICD-10-CM | POA: Diagnosis not present

## 2020-08-30 DIAGNOSIS — K219 Gastro-esophageal reflux disease without esophagitis: Secondary | ICD-10-CM | POA: Diagnosis not present

## 2020-08-30 DIAGNOSIS — E7800 Pure hypercholesterolemia, unspecified: Secondary | ICD-10-CM | POA: Diagnosis not present

## 2020-08-30 DIAGNOSIS — M159 Polyosteoarthritis, unspecified: Secondary | ICD-10-CM | POA: Diagnosis not present

## 2020-08-30 DIAGNOSIS — K589 Irritable bowel syndrome without diarrhea: Secondary | ICD-10-CM | POA: Diagnosis not present

## 2020-08-30 DIAGNOSIS — M810 Age-related osteoporosis without current pathological fracture: Secondary | ICD-10-CM | POA: Diagnosis not present

## 2020-08-30 DIAGNOSIS — D464 Refractory anemia, unspecified: Secondary | ICD-10-CM | POA: Diagnosis not present

## 2020-08-30 DIAGNOSIS — J310 Chronic rhinitis: Secondary | ICD-10-CM | POA: Diagnosis not present

## 2020-08-30 DIAGNOSIS — E039 Hypothyroidism, unspecified: Secondary | ICD-10-CM | POA: Diagnosis not present

## 2020-08-30 DIAGNOSIS — Z139 Encounter for screening, unspecified: Secondary | ICD-10-CM | POA: Diagnosis not present

## 2020-08-30 DIAGNOSIS — J309 Allergic rhinitis, unspecified: Secondary | ICD-10-CM | POA: Diagnosis not present

## 2020-08-30 DIAGNOSIS — I10 Essential (primary) hypertension: Secondary | ICD-10-CM | POA: Diagnosis not present

## 2020-09-22 DIAGNOSIS — M810 Age-related osteoporosis without current pathological fracture: Secondary | ICD-10-CM | POA: Diagnosis not present

## 2020-09-22 DIAGNOSIS — K219 Gastro-esophageal reflux disease without esophagitis: Secondary | ICD-10-CM | POA: Diagnosis not present

## 2020-09-22 DIAGNOSIS — F329 Major depressive disorder, single episode, unspecified: Secondary | ICD-10-CM | POA: Diagnosis not present

## 2020-09-22 DIAGNOSIS — D464 Refractory anemia, unspecified: Secondary | ICD-10-CM | POA: Diagnosis not present

## 2020-09-22 DIAGNOSIS — K589 Irritable bowel syndrome without diarrhea: Secondary | ICD-10-CM | POA: Diagnosis not present

## 2020-09-22 DIAGNOSIS — J310 Chronic rhinitis: Secondary | ICD-10-CM | POA: Diagnosis not present

## 2020-09-22 DIAGNOSIS — I10 Essential (primary) hypertension: Secondary | ICD-10-CM | POA: Diagnosis not present

## 2020-09-22 DIAGNOSIS — M159 Polyosteoarthritis, unspecified: Secondary | ICD-10-CM | POA: Diagnosis not present

## 2020-09-22 DIAGNOSIS — M659 Synovitis and tenosynovitis, unspecified: Secondary | ICD-10-CM | POA: Diagnosis not present

## 2020-09-22 DIAGNOSIS — E871 Hypo-osmolality and hyponatremia: Secondary | ICD-10-CM | POA: Diagnosis not present

## 2020-09-22 DIAGNOSIS — D72829 Elevated white blood cell count, unspecified: Secondary | ICD-10-CM | POA: Diagnosis not present

## 2020-09-22 DIAGNOSIS — J309 Allergic rhinitis, unspecified: Secondary | ICD-10-CM | POA: Diagnosis not present

## 2020-10-06 DIAGNOSIS — G43109 Migraine with aura, not intractable, without status migrainosus: Secondary | ICD-10-CM | POA: Diagnosis not present

## 2020-10-06 DIAGNOSIS — H353132 Nonexudative age-related macular degeneration, bilateral, intermediate dry stage: Secondary | ICD-10-CM | POA: Diagnosis not present

## 2020-10-07 DIAGNOSIS — M659 Synovitis and tenosynovitis, unspecified: Secondary | ICD-10-CM | POA: Diagnosis not present

## 2020-10-07 DIAGNOSIS — J310 Chronic rhinitis: Secondary | ICD-10-CM | POA: Diagnosis not present

## 2020-10-07 DIAGNOSIS — E871 Hypo-osmolality and hyponatremia: Secondary | ICD-10-CM | POA: Diagnosis not present

## 2020-10-07 DIAGNOSIS — K219 Gastro-esophageal reflux disease without esophagitis: Secondary | ICD-10-CM | POA: Diagnosis not present

## 2020-10-07 DIAGNOSIS — D72829 Elevated white blood cell count, unspecified: Secondary | ICD-10-CM | POA: Diagnosis not present

## 2020-10-07 DIAGNOSIS — M159 Polyosteoarthritis, unspecified: Secondary | ICD-10-CM | POA: Diagnosis not present

## 2020-10-07 DIAGNOSIS — F329 Major depressive disorder, single episode, unspecified: Secondary | ICD-10-CM | POA: Diagnosis not present

## 2020-10-07 DIAGNOSIS — K589 Irritable bowel syndrome without diarrhea: Secondary | ICD-10-CM | POA: Diagnosis not present

## 2020-10-07 DIAGNOSIS — J309 Allergic rhinitis, unspecified: Secondary | ICD-10-CM | POA: Diagnosis not present

## 2020-10-07 DIAGNOSIS — I10 Essential (primary) hypertension: Secondary | ICD-10-CM | POA: Diagnosis not present

## 2020-10-07 DIAGNOSIS — D464 Refractory anemia, unspecified: Secondary | ICD-10-CM | POA: Diagnosis not present

## 2020-10-07 DIAGNOSIS — M810 Age-related osteoporosis without current pathological fracture: Secondary | ICD-10-CM | POA: Diagnosis not present

## 2020-10-21 DIAGNOSIS — J310 Chronic rhinitis: Secondary | ICD-10-CM | POA: Diagnosis not present

## 2020-10-21 DIAGNOSIS — K589 Irritable bowel syndrome without diarrhea: Secondary | ICD-10-CM | POA: Diagnosis not present

## 2020-10-21 DIAGNOSIS — M810 Age-related osteoporosis without current pathological fracture: Secondary | ICD-10-CM | POA: Diagnosis not present

## 2020-10-21 DIAGNOSIS — K219 Gastro-esophageal reflux disease without esophagitis: Secondary | ICD-10-CM | POA: Diagnosis not present

## 2020-10-21 DIAGNOSIS — D464 Refractory anemia, unspecified: Secondary | ICD-10-CM | POA: Diagnosis not present

## 2020-10-21 DIAGNOSIS — D72829 Elevated white blood cell count, unspecified: Secondary | ICD-10-CM | POA: Diagnosis not present

## 2020-10-21 DIAGNOSIS — F329 Major depressive disorder, single episode, unspecified: Secondary | ICD-10-CM | POA: Diagnosis not present

## 2020-10-21 DIAGNOSIS — M659 Synovitis and tenosynovitis, unspecified: Secondary | ICD-10-CM | POA: Diagnosis not present

## 2020-10-21 DIAGNOSIS — J309 Allergic rhinitis, unspecified: Secondary | ICD-10-CM | POA: Diagnosis not present

## 2020-10-21 DIAGNOSIS — M159 Polyosteoarthritis, unspecified: Secondary | ICD-10-CM | POA: Diagnosis not present

## 2020-10-21 DIAGNOSIS — E871 Hypo-osmolality and hyponatremia: Secondary | ICD-10-CM | POA: Diagnosis not present

## 2020-10-21 DIAGNOSIS — I10 Essential (primary) hypertension: Secondary | ICD-10-CM | POA: Diagnosis not present

## 2020-11-29 ENCOUNTER — Ambulatory Visit: Payer: Medicare Other | Admitting: Allergy

## 2020-11-29 DIAGNOSIS — K589 Irritable bowel syndrome without diarrhea: Secondary | ICD-10-CM | POA: Diagnosis not present

## 2020-11-29 DIAGNOSIS — F329 Major depressive disorder, single episode, unspecified: Secondary | ICD-10-CM | POA: Diagnosis not present

## 2020-11-29 DIAGNOSIS — D72829 Elevated white blood cell count, unspecified: Secondary | ICD-10-CM | POA: Diagnosis not present

## 2020-11-29 DIAGNOSIS — K219 Gastro-esophageal reflux disease without esophagitis: Secondary | ICD-10-CM | POA: Diagnosis not present

## 2020-11-29 DIAGNOSIS — J309 Allergic rhinitis, unspecified: Secondary | ICD-10-CM | POA: Diagnosis not present

## 2020-11-29 DIAGNOSIS — J310 Chronic rhinitis: Secondary | ICD-10-CM | POA: Diagnosis not present

## 2020-11-29 DIAGNOSIS — E871 Hypo-osmolality and hyponatremia: Secondary | ICD-10-CM | POA: Diagnosis not present

## 2020-11-29 DIAGNOSIS — E7800 Pure hypercholesterolemia, unspecified: Secondary | ICD-10-CM | POA: Diagnosis not present

## 2020-11-29 DIAGNOSIS — I10 Essential (primary) hypertension: Secondary | ICD-10-CM | POA: Diagnosis not present

## 2020-11-29 DIAGNOSIS — M159 Polyosteoarthritis, unspecified: Secondary | ICD-10-CM | POA: Diagnosis not present

## 2020-11-29 DIAGNOSIS — M810 Age-related osteoporosis without current pathological fracture: Secondary | ICD-10-CM | POA: Diagnosis not present

## 2020-11-29 DIAGNOSIS — D464 Refractory anemia, unspecified: Secondary | ICD-10-CM | POA: Diagnosis not present

## 2021-01-25 ENCOUNTER — Telehealth: Payer: Self-pay | Admitting: Gastroenterology

## 2021-01-25 NOTE — Telephone Encounter (Signed)
PT is having a colitis flare up and she is to see an APP on 02/15/21 but would like a sooner apt or some advice on what to do in the meantime. Please give her a call. Thank you

## 2021-01-25 NOTE — Telephone Encounter (Signed)
The pt has not been seen since 09/2018.  She states she has abd pain and spasms.  She thinks it is possible colitis flare. I see no mention of colitis in the chart.  The pt has an appt on 6/8 with Ellouise Newer.  She is asking for sooner appt. We have no sooner appts available.  She says that the pain is more of a discomfort/spam.  I have advised that if she has severe pain, fever, nausea or rectal bleeding she should see Urgent care or ED.  She was also advised she can call her PCP for an eval prior to her appt with our office.  She will proceed if her symptoms worsen.  Other wise she will keep appt for 6/8.

## 2021-02-15 ENCOUNTER — Ambulatory Visit (INDEPENDENT_AMBULATORY_CARE_PROVIDER_SITE_OTHER): Payer: Medicare Other | Admitting: Physician Assistant

## 2021-02-15 ENCOUNTER — Encounter: Payer: Self-pay | Admitting: Physician Assistant

## 2021-02-15 VITALS — BP 122/70 | HR 65 | Ht 63.0 in | Wt 133.0 lb

## 2021-02-15 DIAGNOSIS — K52832 Lymphocytic colitis: Secondary | ICD-10-CM | POA: Diagnosis not present

## 2021-02-15 DIAGNOSIS — R1013 Epigastric pain: Secondary | ICD-10-CM | POA: Diagnosis not present

## 2021-02-15 DIAGNOSIS — K219 Gastro-esophageal reflux disease without esophagitis: Secondary | ICD-10-CM | POA: Diagnosis not present

## 2021-02-15 DIAGNOSIS — K582 Mixed irritable bowel syndrome: Secondary | ICD-10-CM | POA: Diagnosis not present

## 2021-02-15 MED ORDER — DICYCLOMINE HCL 10 MG PO CAPS: 10.0000 mg | ORAL_CAPSULE | Freq: Four times a day (QID) | ORAL | 3 refills | Status: DC | PRN

## 2021-02-15 NOTE — Progress Notes (Signed)
Chief Complaint: Diarrhea and abdominal pain  HPI:    Brandy Torres is a 75 year old female with a past medical history as listed below including fibromyalgia and reflux along with IBS, known to Dr. Lyndel Safe, who was referred to me by Cher Nakai, MD for a complaint of diarrhea and abdominal pain.      09/26/2018 patient seen in clinic by Dr. Lyndel Safe for epigastric pain.  Also discussed reflux and IBS with predominant constipation and history of colon polyps.  She is scheduled for EGD and colonoscopy.  Also had abdominal ultrasound and given Dexilant for 2 weeks.  Ordered CBC, CMP, lipase, CRP and celiac screen.  Was discussed that if she still continued problems then she would have a CT.    09/26/2018 CRP, lipase, CBC, celiac panel, and CMP normal.    10/02/2018 ultrasound the abdomen with multiple cysts throughout the liver.    10/09/2018 colonoscopy with moderate predominantly sigmoid diverticulosis and small internal hemorrhoids otherwise normal.  Pathology showed lymphocytic colitis.  Patient given budesonide 9 mg p.o. daily x8 weeks, then 6 mg for 2 weeks then 3 mg for another 2 weeks.  Told to avoid NSAIDs and use Imodium as needed.    10/09/2018 EGD with hiatal hernia multiple gastric polyps.  Pathology showed fundic gland polyps.    Today, the patient tells me that the last week of April she started with what she describes as an epigastric spasm so she started drinking only room temperature water etc. and avoided quick movements, then the first week of May she started with what she calls colitis explaining that this is multiple loose watery stools with some accompanying abdominal pain.  She tells me that eating anything would make this worse so she really scaled back on her diet.  Explains that every 2 to 3 days she would have diarrhea and abdominal pain.  Also discussed this epigastric pain which seemed to radiate over to her left upper quadrant and make her side hurts so much that she cannot sleep.  Pain  was worse with movement and when she had to take a deep breath.  Also had a lot of gas and pressure.  Radiated through to her back.  No heartburn or reflux.  Occasional nausea.    Now over the past week or so things are seeming to settle down a little bit.  She tells me that she would call them severe before and now they are moderate.  She can eat more "normal" food in a modified portion and the pain is not constant and she has been swaying towards constipation in fact she even took a dose of MiraLAX a couple of days ago due to the fact that she had not had a bowel movement in over 24 hours.    Typically patient describes that she will radiate back-and-forth from diarrhea to constipation typically uses MiraLAX as needed.  Also describes her history of gastritis and a hiatal hernia and being on Nexium.  She is currently taking over-the-counter 20 mg, 2 capsules once a day which helps with reflux symptoms.  She was tried on various other products including Dexilant in the past which had side effects for her including diarrhea.    Denies fever, chills or blood in her stool.  Past Medical History:  Diagnosis Date  . Acid reflux   . Allergic rhinoconjunctivitis   . Allergy   . Anemia   . Anxiety   . Asthma   . AV nodal re-entry tachycardia ([place])  Cardiac Ablation  . Cataract   . Chronic headaches   . Colon polyps   . Depression   . Duodenal ulcer   . Fibromyalgia   . GERD (gastroesophageal reflux disease)   . High blood pressure   . High cholesterol   . Hypothyroidism   . IBS (irritable bowel syndrome)   . Lymphoma ([facility])    multiple and benign per patient   . Osteoarthritis   . Osteoporosis   . UTI (urinary tract infection)     Past Surgical History:  Procedure Laterality Date  . ABLATION  2000   Cardiac ablation  . ADENOIDECTOMY    . BLADDER SURGERY  2002   Bladder Tack and Sling  . CATARACT EXTRACTION  2016  . COLONOSCOPY  12/14/2009   Mild sigmoid diverticulosis. Small  internal hemorrhoids. Otherwise normal colonoscopy to TI.   Marland Kitchen ESOPHAGOGASTRODUODENOSCOPY  11/10/2012   Small hiatal hernia. Gastric polyps (status post polypectomy x2) with slightly increased size of the gastric polyps as compared to the previous EGD.   Marland Kitchen KNEE ARTHROSCOPY Right 2011  . THYROIDECTOMY  1984  . TONSILLECTOMY     age 71  . TUBAL LIGATION  1982  . UPPER GASTROINTESTINAL ENDOSCOPY      Current Outpatient Medications  Medication Sig Dispense Refill  . Acetaminophen (TYLENOL PO) Take 650 mg by mouth as needed.    Marland Kitchen amLODipine (NORVASC) 5 MG tablet Take 5 mg by mouth daily.   5  . azelastine (ASTELIN) 0.1 % nasal spray Place 2 sprays into both nostrils 2 (two) times daily. Use in each nostril as directed 30 mL 5  . budesonide-formoterol (SYMBICORT) 160-4.5 MCG/ACT inhaler INHALE 2 PUFFS BY MOUTH TWICE DAILY TO PREVENT COUGH OR WHEEZE. RINSE, GARGLE AND SPIT AFTER USE 33 g 0  . buPROPion (WELLBUTRIN XL) 150 MG 24 hr tablet Take 450 mg by mouth daily.    . Calcium Carb-Cholecalciferol (CALCIUM 600+D3) 600-800 MG-UNIT TABS Take 1 tablet by mouth 2 (two) times daily.    Marland Kitchen escitalopram (LEXAPRO) 20 MG tablet Take 20 mg by mouth daily.   5  . esomeprazole (NEXIUM) 40 MG capsule Take 40 mg by mouth daily at 12 noon.    Marland Kitchen FIBER PO Take by mouth as needed.    . levalbuterol (XOPENEX HFA) 45 MCG/ACT inhaler INHALE 2 PUFFS BY MOUTH EVERY 4 TO 6 HOURS AS NEEDED FOR COUGH FOR WHEEZING 15 g 0  . levothyroxine (SYNTHROID) 175 MCG tablet Take 175 mcg by mouth daily.    . Methylsulfonylmethane (MSM) 1500 MG TABS Take by mouth 2 (two) times daily.    . Multiple Vitamins-Minerals (MULTIVITAMIN ADULT PO) Take by mouth daily.    . rosuvastatin (CRESTOR) 20 MG tablet Take 20 mg by mouth daily.   3  . XHANCE 93 MCG/ACT EXHU Blow two sprays into each nostril twice daily as directed. 32 mL 5   No current facility-administered medications for this visit.    Allergies as of 02/15/2021 - Review Complete  06/07/2020  Allergen Reaction Noted  . Avelox [moxifloxacin hcl in nacl] Swelling 02/04/2018  . Benzoxonium chloride [benzoxonium]  02/04/2018  . Celebrex [celecoxib] Other (See Comments) 02/04/2018  . Dexilant [dexlansoprazole] Other (See Comments) 06/02/2019  . Doxycycline Other (See Comments) 09/15/2019  . Penicillins Other (See Comments) 02/04/2018  . Prevacid [lansoprazole] Other (See Comments) 06/02/2019  . Singulair [montelukast sodium] Other (See Comments) 06/07/2020    Family History  Problem Relation Age of Onset  . COPD Father   .  Coronary artery disease Father   . Rheum arthritis Sister   . Breast cancer Maternal Aunt   . Breast cancer Maternal Grandmother   . Paget's disease of bone Maternal Aunt   . Allergic rhinitis Son   . Dystonia Son   . Rheum arthritis Son   . Diabetes Sister   . Colonic polyp Mother   . Stomach cancer Paternal Aunt   . Pancreatic cancer Paternal Uncle   . Pancreatic cancer Paternal Uncle   . Colon cancer Neg Hx   . Esophageal cancer Neg Hx   . Rectal cancer Neg Hx   . Thyroid disease Neg Hx     Social History   Socioeconomic History  . Marital status: Married    Spouse name: Not on file  . Number of children: 1  . Years of education: Not on file  . Highest education level: Not on file  Occupational History  . Occupation: Retired   Tobacco Use  . Smoking status: Never Smoker  . Smokeless tobacco: Never Used  Vaping Use  . Vaping Use: Never used  Substance and Sexual Activity  . Alcohol use: Never  . Drug use: Never  . Sexual activity: Not on file  Other Topics Concern  . Not on file  Social History Narrative  . Not on file   Social Determinants of Health   Financial Resource Strain: Not on file  Food Insecurity: Not on file  Transportation Needs: Not on file  Physical Activity: Not on file  Stress: Not on file  Social Connections: Not on file  Intimate Partner Violence: Not on file    Review of Systems:     Constitutional: No weight loss, fever or chills Cardiovascular: No chest pain Respiratory: No SOB  Gastrointestinal: See HPI and otherwise negative   Physical Exam:  Vital signs: BP 122/70   Pulse 65   Ht 5\' 3"  (1.6 m)   Wt 133 lb (60.3 kg)   BMI 23.56 kg/m   Constitutional:   Pleasant Caucasian female appears to be in NAD, Well developed, Well nourished, alert and cooperative Respiratory: Respirations even and unlabored. Lungs clear to auscultation bilaterally.   No wheezes, crackles, or rhonchi.  Cardiovascular: Normal S1, S2. No MRG. Regular rate and rhythm. No peripheral edema, cyanosis or pallor.  Gastrointestinal:  Soft, nondistended, mild generalized TTP, some worse in the left upper quadrant and epigastrium no rebound or guarding. Normal bowel sounds. No appreciable masses or hepatomegaly. Rectal:  Not performed.  Psychiatric: Demonstrates good judgement and reason without abnormal affect or behaviors.  No recent labs or imaging.  Assessment: 1.  Generalized abdominal pain, some worse in the epigastrium/left upper quadrant: Believe this is related to her gastritis/GERD+/- gas pain/splenic flexure syndrome 2.  IBS-mixed: Radiates from diarrhea to constipation, currently constipated 3.  Nausea 4.  Lymphocytic colitis: Seen on biopsies from colonoscopy 2020  Plan: 1.  Had a long discussion with the patient about irritable bowel syndrome.  I believe this is what she is experiencing now rather than her lymphocytic colitis.  Explained the difference.  Answered all of her questions. 2.  At this point recommend a daily probiotic such as Align once daily over the next 2 months. 3.  Recommend patient started daily fiber supplement such as Metamucil, Citrucel or Benefiber. 4.  Increase Nexium to 40 mg twice daily, 30-60 minutes before breakfast and dinner for the next 1 to 2 months for epigastric pain 5.  Prescribed Dicyclomine 10 mg every 6 hours  as needed for abdominal cramping or  diarrhea.  Discussed usage of this with the patient.  For now we will trial as needed, but this may need to be scheduled in the future.  Discussed that this may sway her towards constipation she may need to use more MiraLAX and normal. 6.  Continue as needed MiraLAX 7.  Patient to follow in clinic with me in 1 to 2 months to see how things are going.  Brandy Newer, PA-C [facility] 02/15/2021, 10:03 AM  Cc: Cher Nakai, MD

## 2021-02-15 NOTE — Patient Instructions (Addendum)
Start Benefiber or Citrucel 2 teaspoons in 8 ounces of liquid daily.  Start Align probiotic daily.  Please purchase the following medications over the counter and take as directed: Nexium 40 mg twice daily for 2 months.  We have sent the following medications to your pharmacy for you to pick up at your convenience: Dicyclomine 10 mg every 6 hours as needed for diarrhea or abdominal spasm.  Follow up 1-2 months.  If you are age 76 or older, your body mass index should be between 23-30. Your Body mass index is 23.56 kg/m. If this is out of the aforementioned range listed, please consider follow up with your Primary Care Provider.  If you are age 51 or younger, your body mass index should be between 19-25. Your Body mass index is 23.56 kg/m. If this is out of the aformentioned range listed, please consider follow up with your Primary Care Provider.   __________________________________________________________  The [place] [facility] providers would like to encourage you to use [facility] to communicate with providers for non-urgent requests or questions.  Due to long hold times on the telephone, sending your provider a message by [facility] may be a faster and more efficient way to get a response.  Please allow 48 business hours for a response.  Please remember that this is for non-urgent requests.

## 2021-02-28 DIAGNOSIS — M810 Age-related osteoporosis without current pathological fracture: Secondary | ICD-10-CM | POA: Diagnosis not present

## 2021-02-28 DIAGNOSIS — I10 Essential (primary) hypertension: Secondary | ICD-10-CM | POA: Diagnosis not present

## 2021-02-28 DIAGNOSIS — D464 Refractory anemia, unspecified: Secondary | ICD-10-CM | POA: Diagnosis not present

## 2021-02-28 DIAGNOSIS — D72829 Elevated white blood cell count, unspecified: Secondary | ICD-10-CM | POA: Diagnosis not present

## 2021-02-28 DIAGNOSIS — K589 Irritable bowel syndrome without diarrhea: Secondary | ICD-10-CM | POA: Diagnosis not present

## 2021-02-28 DIAGNOSIS — J310 Chronic rhinitis: Secondary | ICD-10-CM | POA: Diagnosis not present

## 2021-02-28 DIAGNOSIS — M159 Polyosteoarthritis, unspecified: Secondary | ICD-10-CM | POA: Diagnosis not present

## 2021-02-28 DIAGNOSIS — F329 Major depressive disorder, single episode, unspecified: Secondary | ICD-10-CM | POA: Diagnosis not present

## 2021-02-28 DIAGNOSIS — J309 Allergic rhinitis, unspecified: Secondary | ICD-10-CM | POA: Diagnosis not present

## 2021-02-28 DIAGNOSIS — E7800 Pure hypercholesterolemia, unspecified: Secondary | ICD-10-CM | POA: Diagnosis not present

## 2021-02-28 DIAGNOSIS — E039 Hypothyroidism, unspecified: Secondary | ICD-10-CM | POA: Diagnosis not present

## 2021-02-28 DIAGNOSIS — E871 Hypo-osmolality and hyponatremia: Secondary | ICD-10-CM | POA: Diagnosis not present

## 2021-02-28 DIAGNOSIS — K219 Gastro-esophageal reflux disease without esophagitis: Secondary | ICD-10-CM | POA: Diagnosis not present

## 2021-03-10 NOTE — Progress Notes (Signed)
Agree with plan RG 

## 2021-03-30 ENCOUNTER — Ambulatory Visit (INDEPENDENT_AMBULATORY_CARE_PROVIDER_SITE_OTHER): Payer: Medicare Other | Admitting: Physician Assistant

## 2021-03-30 ENCOUNTER — Encounter: Payer: Self-pay | Admitting: Physician Assistant

## 2021-03-30 VITALS — BP 124/74 | HR 60 | Ht 62.0 in | Wt 135.4 lb

## 2021-03-30 DIAGNOSIS — K582 Mixed irritable bowel syndrome: Secondary | ICD-10-CM | POA: Diagnosis not present

## 2021-03-30 DIAGNOSIS — R1013 Epigastric pain: Secondary | ICD-10-CM

## 2021-03-30 NOTE — Progress Notes (Signed)
Chief Complaint: Follow-up abdominal pain and IBS  HPI:     Brandy Torres is a 75 year old female with a past medical history as listed below including fibromyalgia, reflux and IBS, known to Dr. Lyndel Safe, who returns to clinic today for follow-up of her abdominal pain and IBS.      09/26/2018 patient seen in clinic by Dr. Lyndel Safe for epigastric pain.  Also discussed reflux and IBS with predominant constipation and history of colon polyps.  She is scheduled for EGD and colonoscopy.  Also had abdominal ultrasound and given Dexilant for 2 weeks.  Ordered CBC, CMP, lipase, CRP and celiac screen.  Was discussed that if she still continued problems then she would have a CT.    09/26/2018 CRP, lipase, CBC, celiac panel, and CMP normal.    10/02/2018 ultrasound the abdomen with multiple cysts throughout the liver.    10/09/2018 colonoscopy with moderate predominantly sigmoid diverticulosis and small internal hemorrhoids otherwise normal.  Pathology showed lymphocytic colitis.  Patient given budesonide 9 mg p.o. daily x8 weeks, then 6 mg for 2 weeks then 3 mg for another 2 weeks.  Told to avoid NSAIDs and use Imodium as needed.    10/09/2018 EGD with hiatal hernia multiple gastric polyps.  Pathology showed fundic gland polyps.    02/15/2021 patient seen in clinic and describes some epigastric spasm and also multiple loose watery stools with some accompanying abdominal pain for a period of time, which changed constipation at time of that visit.  Was using over-the-counter Nexium 20 mg, 2 capsules once a day to help with reflux symptoms.  At that time discussed that sounded more like irritable bowel at the moment and recommend she start a daily probiotic such as align and fiber supplement.  Increase Nexium to 40 mg twice daily with plans to continue this for the next 1 to 2 months for epigastric pain and then decrease.  Also prescribe dicyclomine 10 mg every 6 hours as needed for abdominal cramping and diarrhea.  Patient  continued on as needed MiraLAX.    Today, the patient presents to clinic and tells me that she is almost 100% better.  Describes that she has started a probiotic and Benefiber supplement daily and has only had 5-6 episodes of diarrhea in the past 6 weeks.  In general she feels like the fiber has helped her to have more consistent stool and is happy with the results.  She has used her Dicyclomine before going to bed at night occasionally for some spasms, which seems to help.  Tells me she tried taking this during the day which is seemed to "alter my vision".    Has also been using her Nexium 40 mg twice daily for her epigastric pain and nausea, but feels like this has increased her hair thinning.  Apparently read online that this can be a rare side effect from this medicine.  She is happy that it has made her stomach feel better but is not happy with the side effect.  Explains that she now only has occasional epigastric discomfort or spasm when she lays on the left side of her abdomen going to sleep but it "does not last long".    Denies fever, chills, weight loss or symptoms that awaken her from sleep.  Past Medical History:  Diagnosis Date   Acid reflux    Allergic rhinoconjunctivitis    Allergy    Anemia    Anxiety    Asthma    AV nodal re-entry tachycardia (  [place])    Cardiac Ablation   Cataract    Chronic headaches    Colon polyps    Depression    Duodenal ulcer    Fibromyalgia    GERD (gastroesophageal reflux disease)    High blood pressure    High cholesterol    Hypothyroidism    IBS (irritable bowel syndrome)    Lymphoma ([facility])    multiple and benign per patient    Osteoarthritis    Osteoporosis    UTI (urinary tract infection)     Past Surgical History:  Procedure Laterality Date   ABLATION  2000   Cardiac ablation   ADENOIDECTOMY     BLADDER SURGERY  2002   Bladder Tack and Sling   CATARACT EXTRACTION  2016   COLONOSCOPY  12/14/2009   Mild sigmoid diverticulosis. Small  internal hemorrhoids. Otherwise normal colonoscopy to TI.    ESOPHAGOGASTRODUODENOSCOPY  11/10/2012   Small hiatal hernia. Gastric polyps (status post polypectomy x2) with slightly increased size of the gastric polyps as compared to the previous EGD.    KNEE ARTHROSCOPY Right 2011   THYROIDECTOMY  1984   TONSILLECTOMY     age 42   TUBAL LIGATION  1982   UPPER GASTROINTESTINAL ENDOSCOPY      Current Outpatient Medications  Medication Sig Dispense Refill   Acetaminophen (TYLENOL PO) Take 650 mg by mouth as needed.     amLODipine (NORVASC) 5 MG tablet Take 5 mg by mouth daily.   5   azelastine (ASTELIN) 0.1 % nasal spray Place 2 sprays into both nostrils 2 (two) times daily. Use in each nostril as directed 30 mL 5   budesonide-formoterol (SYMBICORT) 160-4.5 MCG/ACT inhaler INHALE 2 PUFFS BY MOUTH TWICE DAILY TO PREVENT COUGH OR WHEEZE. RINSE, GARGLE AND SPIT AFTER USE 33 g 0   buPROPion (WELLBUTRIN XL) 150 MG 24 hr tablet Take 450 mg by mouth daily.     Calcium Carb-Cholecalciferol 600-800 MG-UNIT TABS Take 1 tablet by mouth 2 (two) times daily.     dicyclomine (BENTYL) 10 MG capsule Take 1 capsule (10 mg total) by mouth every 6 (six) hours as needed for spasms. 60 capsule 3   escitalopram (LEXAPRO) 20 MG tablet Take 20 mg by mouth daily.   5   esomeprazole (NEXIUM) 20 MG capsule Take 40 mg by mouth daily at 12 noon.     FIBER PO Take by mouth as needed.     Glucosamine HCl 1500 MG TABS Take by mouth.     levalbuterol (XOPENEX HFA) 45 MCG/ACT inhaler INHALE 2 PUFFS BY MOUTH EVERY 4 TO 6 HOURS AS NEEDED FOR COUGH FOR WHEEZING 15 g 0   levothyroxine (EUTHYROX) 175 MCG tablet Take 175 mcg by mouth daily before breakfast.     Methylsulfonylmethane (MSM) 1500 MG TABS Take by mouth 2 (two) times daily.     Multiple Vitamins-Minerals (MULTIVITAMIN ADULT PO) Take by mouth daily.     rosuvastatin (CRESTOR) 20 MG tablet Take 20 mg by mouth daily.   3   XHANCE 93 MCG/ACT EXHU Blow two sprays into each  nostril twice daily as directed. 32 mL 5   No current facility-administered medications for this visit.    Allergies as of 03/30/2021 - Review Complete 02/15/2021  Allergen Reaction Noted   Avelox [moxifloxacin hcl in nacl] Swelling 02/04/2018   Benzoxonium chloride [benzoxonium]  02/04/2018   Celebrex [celecoxib] Other (See Comments) 02/04/2018   Dexilant [dexlansoprazole] Other (See Comments) 06/02/2019   Doxycycline Other (  See Comments) 09/15/2019   Penicillins Other (See Comments) 02/04/2018   Prevacid [lansoprazole] Other (See Comments) 06/02/2019   Singulair [montelukast sodium] Other (See Comments) 06/07/2020    Family History  Problem Relation Age of Onset   COPD Father    Coronary artery disease Father    Rheum arthritis Sister    Breast cancer Maternal Aunt    Breast cancer Maternal Grandmother    Paget's disease of bone Maternal Aunt    Allergic rhinitis Son    Dystonia Son    Rheum arthritis Son    Diabetes Sister    Colonic polyp Mother    Stomach cancer Paternal Aunt    Pancreatic cancer Paternal Uncle    Pancreatic cancer Paternal Uncle    Colon cancer Neg Hx    Esophageal cancer Neg Hx    Rectal cancer Neg Hx    Thyroid disease Neg Hx     Social History   Socioeconomic History   Marital status: Married    Spouse name: Not on file   Number of children: 1   Years of education: Not on file   Highest education level: Not on file  Occupational History   Occupation: Retired   Tobacco Use   Smoking status: Never   Smokeless tobacco: Never  Vaping Use   Vaping Use: Never used  Substance and Sexual Activity   Alcohol use: Never   Drug use: Never   Sexual activity: Not on file  Other Topics Concern   Not on file  Social History Narrative   Not on file   Social Determinants of Health   Financial Resource Strain: Not on file  Food Insecurity: Not on file  Transportation Needs: Not on file  Physical Activity: Not on file  Stress: Not on file   Social Connections: Not on file  Intimate Partner Violence: Not on file    Review of Systems:    Constitutional: No weight loss, fever or chills Cardiovascular: No chest pain Respiratory: No SOB  Gastrointestinal: See HPI and otherwise negative   Physical Exam:  Vital signs: BP 124/74   Pulse 60   Ht 5\' 2"  (1.575 m)   Wt 135 lb 6.4 oz (61.4 kg)   BMI 24.76 kg/m    Constitutional:   Pleasant Elderly Caucasian female appears to be in NAD, Well developed, Well nourished, alert and cooperative Respiratory: Respirations even and unlabored. Lungs clear to auscultation bilaterally.   No wheezes, crackles, or rhonchi.  Cardiovascular: Normal S1, S2. No MRG. Regular rate and rhythm. No peripheral edema, cyanosis or pallor.  Gastrointestinal:  Soft, nondistended, nontender. No rebound or guarding. Normal bowel sounds. No appreciable masses or hepatomegaly. Rectal:  Not performed.  Psychiatric: Oriented to person, place and time. Demonstrates good judgement and reason without abnormal affect or behaviors.  No recent labs or imaging.  Assessment: 1.  IBS-mixed: Radiates from diarrhea to constipation, much better with addition of Benefiber and a probiotic over the past 6 weeks 2.  Epigastric pain with nausea: Much better after using Nexium 40 mg twice daily over the past 6 weeks; likely related to gastritis  Plan: 1.  Continue Benefiber. 2.  Discussed with the patient that she can to continue Align for another 2 weeks to make it a full 2 months of therapy and then trial stopping this or she can continue if she would like.  Could also try Hardin Negus' colon health as this may be less expensive for her going forward if she wants to  stay on a probiotic. 3.  Discussed with patient that I would like her to finish full 2 months of therapy with Nexium 40 mg twice daily.  This gives her 2 more weeks of being on this medicine, then she can reduce back to once daily dosing given the side effect of hair  thinning.  At that point she will call and let us know how she is doing.  If continues with epigastric symptoms could trial Pepcid in addition to once daily dosing of Nexium 40 mg. 4.  Patient will continue Dicyclomine nightly as needed 5.  Patient to follow in clinic with [place] as needed in the future.  Ellouise Newer, PA-C [facility] 03/30/2021, 10:31 AM  Cc: Cher Nakai, MD

## 2021-03-30 NOTE — Patient Instructions (Addendum)
Continue Nexium 40 mg twice daily for another 2 weeks, then decrease to once daily.  Continue Fiber and Probiotic for another 2 weeks.  Can continue probiotic or try to stop after another 2 weeks. Can try [facility] probiotic daily instead.  Continue Dicyclomine nightly.  If you are age 76 or older, your body mass index should be between 23-30. Your Body mass index is 24.76 kg/m. If this is out of the aforementioned range listed, please consider follow up with your Primary Care Provider.  If you are age 85 or younger, your body mass index should be between 19-25. Your Body mass index is 24.76 kg/m. If this is out of the aformentioned range listed, please consider follow up with your Primary Care Provider.   __________________________________________________________  The [facility] providers would like to encourage you to use [facility] to communicate with providers for non-urgent requests or questions.  Due to long hold times on the telephone, sending your provider a message by [facility] may be a faster and more efficient way to get a response.  Please allow 48 business hours for a response.  Please remember that this is for non-urgent requests.

## 2021-04-01 NOTE — Progress Notes (Signed)
Agree with excellent plan. RG 

## 2021-06-02 DIAGNOSIS — M810 Age-related osteoporosis without current pathological fracture: Secondary | ICD-10-CM | POA: Diagnosis not present

## 2021-06-02 DIAGNOSIS — E7800 Pure hypercholesterolemia, unspecified: Secondary | ICD-10-CM | POA: Diagnosis not present

## 2021-06-02 DIAGNOSIS — M159 Polyosteoarthritis, unspecified: Secondary | ICD-10-CM | POA: Diagnosis not present

## 2021-06-02 DIAGNOSIS — J028 Acute pharyngitis due to other specified organisms: Secondary | ICD-10-CM | POA: Diagnosis not present

## 2021-06-02 DIAGNOSIS — D464 Refractory anemia, unspecified: Secondary | ICD-10-CM | POA: Diagnosis not present

## 2021-06-02 DIAGNOSIS — D72829 Elevated white blood cell count, unspecified: Secondary | ICD-10-CM | POA: Diagnosis not present

## 2021-06-02 DIAGNOSIS — J310 Chronic rhinitis: Secondary | ICD-10-CM | POA: Diagnosis not present

## 2021-06-02 DIAGNOSIS — K219 Gastro-esophageal reflux disease without esophagitis: Secondary | ICD-10-CM | POA: Diagnosis not present

## 2021-06-02 DIAGNOSIS — J309 Allergic rhinitis, unspecified: Secondary | ICD-10-CM | POA: Diagnosis not present

## 2021-06-02 DIAGNOSIS — E871 Hypo-osmolality and hyponatremia: Secondary | ICD-10-CM | POA: Diagnosis not present

## 2021-06-02 DIAGNOSIS — I10 Essential (primary) hypertension: Secondary | ICD-10-CM | POA: Diagnosis not present

## 2021-06-02 DIAGNOSIS — B9689 Other specified bacterial agents as the cause of diseases classified elsewhere: Secondary | ICD-10-CM | POA: Diagnosis not present

## 2021-10-02 DIAGNOSIS — Z23 Encounter for immunization: Secondary | ICD-10-CM | POA: Diagnosis not present

## 2021-10-02 DIAGNOSIS — Z1331 Encounter for screening for depression: Secondary | ICD-10-CM | POA: Diagnosis not present

## 2021-10-02 DIAGNOSIS — I10 Essential (primary) hypertension: Secondary | ICD-10-CM | POA: Diagnosis not present

## 2021-10-02 DIAGNOSIS — Z0000 Encounter for general adult medical examination without abnormal findings: Secondary | ICD-10-CM | POA: Diagnosis not present

## 2021-10-02 DIAGNOSIS — Z79899 Other long term (current) drug therapy: Secondary | ICD-10-CM | POA: Diagnosis not present

## 2021-10-02 DIAGNOSIS — E039 Hypothyroidism, unspecified: Secondary | ICD-10-CM | POA: Diagnosis not present

## 2021-10-02 DIAGNOSIS — E785 Hyperlipidemia, unspecified: Secondary | ICD-10-CM | POA: Diagnosis not present

## 2022-01-27 DIAGNOSIS — R58 Hemorrhage, not elsewhere classified: Secondary | ICD-10-CM | POA: Diagnosis not present

## 2022-01-27 DIAGNOSIS — Z23 Encounter for immunization: Secondary | ICD-10-CM | POA: Diagnosis not present

## 2022-01-27 DIAGNOSIS — M7989 Other specified soft tissue disorders: Secondary | ICD-10-CM | POA: Diagnosis not present

## 2022-01-27 DIAGNOSIS — S61223A Laceration with foreign body of left middle finger without damage to nail, initial encounter: Secondary | ICD-10-CM | POA: Diagnosis not present

## 2022-01-27 DIAGNOSIS — S62603A Fracture of unspecified phalanx of left middle finger, initial encounter for closed fracture: Secondary | ICD-10-CM | POA: Diagnosis not present

## 2022-01-27 DIAGNOSIS — S61213A Laceration without foreign body of left middle finger without damage to nail, initial encounter: Secondary | ICD-10-CM | POA: Diagnosis not present

## 2022-01-30 DIAGNOSIS — S62638B Displaced fracture of distal phalanx of other finger, initial encounter for open fracture: Secondary | ICD-10-CM | POA: Diagnosis not present

## 2022-02-06 DIAGNOSIS — S62638B Displaced fracture of distal phalanx of other finger, initial encounter for open fracture: Secondary | ICD-10-CM | POA: Diagnosis not present

## 2022-02-09 DIAGNOSIS — R531 Weakness: Secondary | ICD-10-CM | POA: Diagnosis not present

## 2022-02-09 DIAGNOSIS — S62638D Displaced fracture of distal phalanx of other finger, subsequent encounter for fracture with routine healing: Secondary | ICD-10-CM | POA: Diagnosis not present

## 2022-02-09 DIAGNOSIS — M25642 Stiffness of left hand, not elsewhere classified: Secondary | ICD-10-CM | POA: Diagnosis not present

## 2022-02-12 DIAGNOSIS — S22080A Wedge compression fracture of T11-T12 vertebra, initial encounter for closed fracture: Secondary | ICD-10-CM | POA: Diagnosis not present

## 2022-02-16 DIAGNOSIS — M47816 Spondylosis without myelopathy or radiculopathy, lumbar region: Secondary | ICD-10-CM | POA: Diagnosis not present

## 2022-02-16 DIAGNOSIS — M4316 Spondylolisthesis, lumbar region: Secondary | ICD-10-CM | POA: Diagnosis not present

## 2022-02-16 DIAGNOSIS — S22080A Wedge compression fracture of T11-T12 vertebra, initial encounter for closed fracture: Secondary | ICD-10-CM | POA: Diagnosis not present

## 2022-02-20 DIAGNOSIS — S22080A Wedge compression fracture of T11-T12 vertebra, initial encounter for closed fracture: Secondary | ICD-10-CM | POA: Diagnosis not present

## 2022-03-07 DIAGNOSIS — S62638D Displaced fracture of distal phalanx of other finger, subsequent encounter for fracture with routine healing: Secondary | ICD-10-CM | POA: Diagnosis not present

## 2022-03-07 DIAGNOSIS — M25642 Stiffness of left hand, not elsewhere classified: Secondary | ICD-10-CM | POA: Diagnosis not present

## 2022-03-07 DIAGNOSIS — R531 Weakness: Secondary | ICD-10-CM | POA: Diagnosis not present

## 2022-03-20 DIAGNOSIS — S62633A Displaced fracture of distal phalanx of left middle finger, initial encounter for closed fracture: Secondary | ICD-10-CM | POA: Diagnosis not present

## 2022-04-04 DIAGNOSIS — S22080A Wedge compression fracture of T11-T12 vertebra, initial encounter for closed fracture: Secondary | ICD-10-CM | POA: Diagnosis not present

## 2022-05-09 DIAGNOSIS — S62638B Displaced fracture of distal phalanx of other finger, initial encounter for open fracture: Secondary | ICD-10-CM | POA: Diagnosis not present

## 2022-05-16 DIAGNOSIS — S22080A Wedge compression fracture of T11-T12 vertebra, initial encounter for closed fracture: Secondary | ICD-10-CM | POA: Diagnosis not present

## 2022-05-16 DIAGNOSIS — M5416 Radiculopathy, lumbar region: Secondary | ICD-10-CM | POA: Diagnosis not present

## 2022-07-04 DIAGNOSIS — Z79899 Other long term (current) drug therapy: Secondary | ICD-10-CM | POA: Diagnosis not present

## 2022-07-04 DIAGNOSIS — F332 Major depressive disorder, recurrent severe without psychotic features: Secondary | ICD-10-CM | POA: Diagnosis not present

## 2022-07-04 DIAGNOSIS — Z23 Encounter for immunization: Secondary | ICD-10-CM | POA: Diagnosis not present

## 2022-07-04 DIAGNOSIS — M810 Age-related osteoporosis without current pathological fracture: Secondary | ICD-10-CM | POA: Diagnosis not present

## 2022-07-04 DIAGNOSIS — E785 Hyperlipidemia, unspecified: Secondary | ICD-10-CM | POA: Diagnosis not present

## 2022-07-04 DIAGNOSIS — E039 Hypothyroidism, unspecified: Secondary | ICD-10-CM | POA: Diagnosis not present

## 2022-07-20 DIAGNOSIS — M5416 Radiculopathy, lumbar region: Secondary | ICD-10-CM | POA: Diagnosis not present

## 2022-07-20 DIAGNOSIS — S22080A Wedge compression fracture of T11-T12 vertebra, initial encounter for closed fracture: Secondary | ICD-10-CM | POA: Diagnosis not present

## 2023-04-14 DIAGNOSIS — R0789 Other chest pain: Secondary | ICD-10-CM

## 2023-04-14 DIAGNOSIS — F411 Generalized anxiety disorder: Secondary | ICD-10-CM

## 2023-04-14 DIAGNOSIS — E039 Hypothyroidism, unspecified: Secondary | ICD-10-CM

## 2023-04-15 DIAGNOSIS — R079 Chest pain, unspecified: Secondary | ICD-10-CM

## 2023-04-17 ENCOUNTER — Encounter: Payer: Self-pay | Admitting: Family Medicine

## 2023-10-22 ENCOUNTER — Ambulatory Visit: Payer: Medicare Other | Admitting: Student

## 2023-10-29 ENCOUNTER — Ambulatory Visit: Payer: Medicare Other | Admitting: Student

## 2023-11-04 ENCOUNTER — Encounter: Payer: Self-pay | Admitting: Student

## 2023-11-04 ENCOUNTER — Ambulatory Visit: Payer: Medicare Other | Admitting: Student

## 2023-11-04 VITALS — BP 124/78 | HR 74 | Temp 98.1°F | Resp 18 | Ht 62.0 in | Wt 168.4 lb

## 2023-11-04 DIAGNOSIS — E7800 Pure hypercholesterolemia, unspecified: Secondary | ICD-10-CM | POA: Insufficient documentation

## 2023-11-04 DIAGNOSIS — F419 Anxiety disorder, unspecified: Secondary | ICD-10-CM | POA: Insufficient documentation

## 2023-11-04 DIAGNOSIS — J309 Allergic rhinitis, unspecified: Secondary | ICD-10-CM | POA: Insufficient documentation

## 2023-11-04 DIAGNOSIS — H1010 Acute atopic conjunctivitis, unspecified eye: Secondary | ICD-10-CM | POA: Insufficient documentation

## 2023-11-04 DIAGNOSIS — K589 Irritable bowel syndrome without diarrhea: Secondary | ICD-10-CM | POA: Insufficient documentation

## 2023-11-04 DIAGNOSIS — K219 Gastro-esophageal reflux disease without esophagitis: Secondary | ICD-10-CM | POA: Insufficient documentation

## 2023-11-04 DIAGNOSIS — H269 Unspecified cataract: Secondary | ICD-10-CM | POA: Insufficient documentation

## 2023-11-04 DIAGNOSIS — F323 Major depressive disorder, single episode, severe with psychotic features: Secondary | ICD-10-CM | POA: Diagnosis not present

## 2023-11-04 DIAGNOSIS — I4719 Other supraventricular tachycardia: Secondary | ICD-10-CM | POA: Insufficient documentation

## 2023-11-04 DIAGNOSIS — Z23 Encounter for immunization: Secondary | ICD-10-CM | POA: Diagnosis not present

## 2023-11-04 DIAGNOSIS — M1990 Unspecified osteoarthritis, unspecified site: Secondary | ICD-10-CM | POA: Insufficient documentation

## 2023-11-04 DIAGNOSIS — J45909 Unspecified asthma, uncomplicated: Secondary | ICD-10-CM | POA: Insufficient documentation

## 2023-11-04 DIAGNOSIS — C8590 Non-Hodgkin lymphoma, unspecified, unspecified site: Secondary | ICD-10-CM | POA: Insufficient documentation

## 2023-11-04 DIAGNOSIS — M5416 Radiculopathy, lumbar region: Secondary | ICD-10-CM

## 2023-11-04 DIAGNOSIS — M797 Fibromyalgia: Secondary | ICD-10-CM | POA: Insufficient documentation

## 2023-11-04 DIAGNOSIS — M810 Age-related osteoporosis without current pathological fracture: Secondary | ICD-10-CM | POA: Insufficient documentation

## 2023-11-04 HISTORY — DX: Radiculopathy, lumbar region: M54.16

## 2023-11-04 MED ORDER — SERTRALINE HCL 25 MG PO TABS: 50.0000 mg | ORAL_TABLET | Freq: Every day | ORAL | 1 refills | Status: DC

## 2023-11-04 MED ORDER — OLANZAPINE 2.5 MG PO TABS: 2.5000 mg | ORAL_TABLET | Freq: Every day | ORAL | 0 refills | Status: DC

## 2023-11-04 NOTE — Patient Instructions (Addendum)
 Schedule Citalopram                       Sertraline Current dose 40 mg orally once daily None Days 1-7 20 mg orally once daily 50 mg orally once daily Days 8-14 10 mg orally once daily 50 mg orally once daily Day 15             Stop                                   50 mg orally once daily (additional changes may be needed)  Olanzapine take 2.5 mg at bedtime   PLEASE contact me if for adverse effects of medication. Be very careful when changing positions.

## 2023-11-04 NOTE — Assessment & Plan Note (Signed)
 Pt received flu shot

## 2023-11-04 NOTE — Assessment & Plan Note (Addendum)
 Patient to continue buspirone 10 mg as needed.

## 2023-11-04 NOTE — Assessment & Plan Note (Addendum)
 She has significant psychiatric history where she's been prescribed various medications and electroconvulsive treatments. Pt says she was unhappy with previous office's attention to her needs as they were unwilling to change medications.   We will wean her off slowly from citalopram (takeing for over 20 years) in exchange for sertaline 50 mg once a day + olanzapine 2.5 mg at bedtime for agitation. Dose schedule printed out for the patient.   Labs updated: Vit D, CBC, CMP Follow up in 1 month or before if side effects are too severe to manage. We discussed her being a fall risk and measures to take to help decrease risk.  I encourage her to seek emergency attention in the setting of suicidal ideations. She could benefit from psychiatric evaluation and CBT.

## 2023-11-04 NOTE — Progress Notes (Signed)
 New Patient Office Visit  Subjective    Patient ID: Brandy Torres, female    DOB: [birth year]  Age: 78 y.o. MRN: [id number]  CC:  Chief Complaint  Patient presents with   New Patient to Establish Care    HPI Brandy Torres is a 78 year old white female who presents to establish care.  The patient's last primary care provider was [facility] in 2024. Past medical history includes hypothyroidism with thyroidectomy 1984, anxiety, panic attacks, SVT ablation more than 20 years ago, hyperlipidemia and hypertension.   Today the patient requests to discuss concerns of depression, she reports being on antidepressants very many years actually since her son was born (he is 59 years old). She's undergone  8 electroconvulsive treatments and has history of  psychiatric admissions.  Currently taking citalopram this was increased recently to 40 mg and buspirone 10 mg TID for anxiety. She reports these changes occurred during a hospital admission where she had raging panic attacks. Her PCP tried her on vraylar, she stopped this medication reporting severe anger, rage and confusion. Pt states that she has constantly been in an anxious state within the past 2 months that her husband has been ill. Reports being uptight and tense, either eats a lot or not at all and having thoughts of SI without plan.  She says the thoughts of suicide has crossed her mind however she is adamant about not having the plan to do so.  Attributing factors for depression and anxiety: She says a lot of people depend on her. At home she takes care of her husband whose dementia is getting worse, he was recently admitted to the hospital for complete heart block. Her 68 year old grandson whom has ADHD and autism now lives with her and her husband where she takes care of him full time. She also reports that her 79 year old sister is now in a nursing home where she is declining.  Alleviating factors: None. Brandy Torres denies feelings of suicidal  ideation, homicidal ideation, worthlessness, helplessness, hypersomnia, helpless feeling, weight loss, insomnia, loss of appetite, social withdrawal, or loss of interest in pleasurable activities.       11/04/2023    1:44 PM  Depression screen PHQ 2/9  Decreased Interest 2  Down, Depressed, Hopeless 3  PHQ - 2 Score 5  Altered sleeping 0  Tired, decreased energy 2  Change in appetite 2  Feeling bad or failure about yourself  3  Trouble concentrating 1  Moving slowly or fidgety/restless 0  Suicidal thoughts 1  PHQ-9 Score 14     Outpatient Encounter Medications as of 11/04/2023  Medication Sig   busPIRone (BUSPAR) 10 MG tablet Take 10 mg by mouth 3 (three) times daily.   levothyroxine (SYNTHROID) 150 MCG tablet Take 150 mcg by mouth daily.   OLANZapine (ZYPREXA) 2.5 MG tablet Take 1 tablet (2.5 mg total) by mouth at bedtime.   rosuvastatin (CRESTOR) 40 MG tablet Take 40 mg by mouth daily.   sertraline (ZOLOFT) 25 MG tablet Take 2 tablets (50 mg total) by mouth daily.   [DISCONTINUED] citalopram (CELEXA) 40 MG tablet Take 40 mg by mouth at bedtime.   Acetaminophen (TYLENOL PO) Take 650 mg by mouth as needed.   azelastine (ASTELIN) 0.1 % nasal spray Place 2 sprays into both nostrils 2 (two) times daily. Use in each nostril as directed   Calcium Carb-Cholecalciferol 600-800 MG-UNIT TABS Take 1 tablet by mouth 2 (two) times daily.   esomeprazole (NEXIUM)  20 MG capsule Take 40 mg by mouth daily at 12 noon.   FIBER PO Take by mouth as needed.   Glucosamine HCl 1500 MG TABS Take by mouth.   Methylsulfonylmethane (MSM) 1500 MG TABS Take by mouth 2 (two) times daily.   Multiple Vitamins-Minerals (MULTIVITAMIN ADULT PO) Take by mouth daily.   [DISCONTINUED] amLODipine (NORVASC) 5 MG tablet Take 5 mg by mouth daily.    [DISCONTINUED] budesonide-formoterol (SYMBICORT) 160-4.5 MCG/ACT inhaler INHALE 2 PUFFS BY MOUTH TWICE DAILY TO PREVENT COUGH OR WHEEZE. RINSE, GARGLE AND SPIT AFTER USE    [DISCONTINUED] buPROPion (WELLBUTRIN XL) 150 MG 24 hr tablet Take 450 mg by mouth daily.   [DISCONTINUED] dicyclomine (BENTYL) 10 MG capsule Take 1 capsule (10 mg total) by mouth every 6 (six) hours as needed for spasms.   [DISCONTINUED] escitalopram (LEXAPRO) 20 MG tablet Take 20 mg by mouth daily.    [DISCONTINUED] levalbuterol (XOPENEX HFA) 45 MCG/ACT inhaler INHALE 2 PUFFS BY MOUTH EVERY 4 TO 6 HOURS AS NEEDED FOR COUGH FOR WHEEZING   [DISCONTINUED] levothyroxine (SYNTHROID) 175 MCG tablet Take 175 mcg by mouth daily before breakfast.   [DISCONTINUED] rosuvastatin (CRESTOR) 20 MG tablet Take 20 mg by mouth daily.    No facility-administered encounter medications on file as of 11/04/2023.    Past Medical History:  Diagnosis Date   Acid reflux    Allergic rhinoconjunctivitis    Allergy    Anemia    Anxiety    Asthma    AV nodal re-entry tachycardia [facility])    Cardiac Ablation   Cataract    Chronic headaches    Colon polyps    Depression    Duodenal ulcer    Fibromyalgia    GERD (gastroesophageal reflux disease)    High blood pressure    High cholesterol    Hypothyroidism    IBS (irritable bowel syndrome)    Lymphoma (HCC)    multiple and benign per patient    Osteoarthritis    Osteoporosis    Radiculopathy, lumbar region 11/04/2023   UTI (urinary tract infection)     Past Surgical History:  Procedure Laterality Date   ABLATION  2000   Cardiac ablation   ADENOIDECTOMY     BLADDER SURGERY  2002   Bladder Tack and Sling   CATARACT EXTRACTION  2016   COLONOSCOPY  12/14/2009   Mild sigmoid diverticulosis. Small internal hemorrhoids. Otherwise normal colonoscopy to TI.    ESOPHAGOGASTRODUODENOSCOPY  11/10/2012   Small hiatal hernia. Gastric polyps (status post polypectomy x2) with slightly increased size of the gastric polyps as compared to the previous EGD.    KNEE ARTHROSCOPY Right 2011   THYROIDECTOMY  1984   TONSILLECTOMY     age 78   TUBAL LIGATION  1982   UPPER  GASTROINTESTINAL ENDOSCOPY      Family History  Problem Relation Age of Onset   COPD Father    Coronary artery disease Father    Rheum arthritis Sister    Breast cancer Maternal Aunt    Breast cancer Maternal Grandmother    Paget's disease of bone Maternal Aunt    Allergic rhinitis Son    Dystonia Son    Rheum arthritis Son    Diabetes Sister    Colonic polyp Mother    Stomach cancer Paternal Aunt    Pancreatic cancer Paternal Uncle    Pancreatic cancer Paternal Uncle    Colon cancer Neg Hx    Esophageal cancer Neg Hx  Rectal cancer Neg Hx    Thyroid disease Neg Hx     Social History   Socioeconomic History   Marital status: Married    Spouse name: Not on file   Number of children: 1   Years of education: Not on file   Highest education level: Not on file  Occupational History   Occupation: Retired   Tobacco Use   Smoking status: Never   Smokeless tobacco: Never  Vaping Use   Vaping status: Never Used  Substance and Sexual Activity   Alcohol use: Never   Drug use: Never   Sexual activity: Not on file  Other Topics Concern   Not on file  Social History Narrative   Not on file   Social Drivers of Health   Financial Resource Strain: Not on file  Food Insecurity: Not on file  Transportation Needs: Not on file  Physical Activity: Not on file  Stress: Not on file  Social Connections: Not on file  Intimate Partner Violence: Not on file    Review of Systems  Constitutional: Negative.   HENT: Negative.    Respiratory: Negative.    Cardiovascular: Negative.   Gastrointestinal: Negative.   Genitourinary: Negative.   Musculoskeletal: Negative.   Skin: Negative.   Neurological: Negative.   Psychiatric/Behavioral:  Positive for depression. The patient is nervous/anxious.         Objective    BP 124/78 (BP Location: Left Arm, Patient Position: Sitting, Cuff Size: Normal)   Pulse 74   Temp 98.1 F (36.7 C)   Resp 18   Ht 5\' 2"  (1.575 m)   Wt 168  lb 6 oz (76.4 kg)   SpO2 98%   BMI 30.80 kg/m   Physical Exam Vitals reviewed.  Constitutional:      Appearance: Normal appearance. She is obese.  HENT:     Head: Normocephalic and atraumatic.     Nose: Nose normal.     Mouth/Throat:     Mouth: Mucous membranes are moist.  Eyes:     Conjunctiva/sclera: Conjunctivae normal.     Pupils: Pupils are equal, round, and reactive to light.  Cardiovascular:     Rate and Rhythm: Normal rate and regular rhythm.     Pulses: Normal pulses.     Heart sounds: Normal heart sounds. No murmur heard. Pulmonary:     Effort: Pulmonary effort is normal.     Breath sounds: Normal breath sounds.  Abdominal:     General: Bowel sounds are normal. There is no distension.     Palpations: Abdomen is soft.  Musculoskeletal:        General: Normal range of motion.  Skin:    General: Skin is warm and dry.     Capillary Refill: Capillary refill takes less than 2 seconds.  Neurological:     General: No focal deficit present.     Mental Status: She is alert and oriented to person, place, and time.  Psychiatric:        Attention and Perception: Attention normal.        Mood and Affect: Mood is depressed.        Speech: Speech normal.        Behavior: Behavior normal. Behavior is cooperative.        Thought Content: Thought content is not delusional. Thought content does not include homicidal or suicidal ideation. Thought content does not include homicidal or suicidal plan.        Cognition and Memory: Cognition  and memory normal.        Judgment: Judgment normal.     Assessment & Plan:   Problem List Items Addressed This Visit     MDD (major depressive disorder), single episode, severe with psychosis (HCC) - Primary   She has significant psychiatric history where she's been prescribed various medications and electroconvulsive treatments. Pt says she was unhappy with previous office's attention to her needs as they were unwilling to change medications.    We will wean her off slowly from citalopram (takeing for over 20 years) in exchange for sertaline 50 mg once a day + olanzapine 2.5 mg at bedtime for agitation. Dose schedule printed out for the patient.   Labs updated: Vit D, CBC, CMP Follow up in 1 month or before if side effects are too severe to manage. We discussed her being a fall risk and measures to take to help decrease risk.  I encourage her to seek emergency attention in the setting of suicidal ideations. She could benefit from psychiatric evaluation and CBT.       Relevant Medications   busPIRone (BUSPAR) 10 MG tablet   sertraline (ZOLOFT) 25 MG tablet   OLANZapine (ZYPREXA) 2.5 MG tablet   Other Relevant Orders   VITAMIN D 25 Hydroxy (Vit-D Deficiency, Fractures)   CBC with Diff   Comprehensive metabolic panel   Encounter for immunization   Pt received flu shot.       Relevant Orders   Influenza, MDCK, trivalent, PF(Flucelvax egg-free) (Completed)   Anxiety   Patient to continue buspirone 10 mg as needed.       Relevant Medications   busPIRone (BUSPAR) 10 MG tablet   sertraline (ZOLOFT) 25 MG tablet    Return in about 1 month (around 12/02/2023) for follow up of depression .   Edwena Blow, NP

## 2023-11-05 LAB — COMPREHENSIVE METABOLIC PANEL
ALT: 20 [IU]/L (ref 0–32)
AST: 21 [IU]/L (ref 0–40)
Albumin: 4.8 g/dL (ref 3.8–4.8)
Alkaline Phosphatase: 60 [IU]/L (ref 44–121)
BUN/Creatinine Ratio: 31 — ABNORMAL HIGH (ref 12–28)
BUN: 18 mg/dL (ref 8–27)
Bilirubin Total: 0.4 mg/dL (ref 0.0–1.2)
CO2: 24 mmol/L (ref 20–29)
Calcium: 9.8 mg/dL (ref 8.7–10.3)
Chloride: 102 mmol/L (ref 96–106)
Creatinine, Ser: 0.59 mg/dL (ref 0.57–1.00)
Globulin, Total: 1.7 g/dL (ref 1.5–4.5)
Glucose: 89 mg/dL (ref 70–99)
Potassium: 3.7 mmol/L (ref 3.5–5.2)
Sodium: 141 mmol/L (ref 134–144)
Total Protein: 6.5 g/dL (ref 6.0–8.5)
eGFR: 92 mL/min/{1.73_m2} (ref >59)

## 2023-11-05 LAB — CBC WITH DIFFERENTIAL/PLATELET
Basophils Absolute: 0.1 10*3/uL (ref 0.0–0.2)
Basos: 1 %
EOS (ABSOLUTE): 0.2 10*3/uL (ref 0.0–0.4)
Eos: 2 %
Hematocrit: 39.7 % (ref 34.0–46.6)
Hemoglobin: 12.8 g/dL (ref 11.1–15.9)
Immature Grans (Abs): 0 10*3/uL (ref 0.0–0.1)
Immature Granulocytes: 0 %
Lymphocytes Absolute: 3 10*3/uL (ref 0.7–3.1)
Lymphs: 36 %
MCH: 30.1 pg (ref 26.6–33.0)
MCHC: 32.2 g/dL (ref 31.5–35.7)
MCV: 93 fL (ref 79–97)
Monocytes Absolute: 0.5 10*3/uL (ref 0.1–0.9)
Monocytes: 6 %
Neutrophils Absolute: 4.6 10*3/uL (ref 1.4–7.0)
Neutrophils: 55 %
Platelets: 193 10*3/uL (ref 150–450)
RBC: 4.25 x10E6/uL (ref 3.77–5.28)
RDW: 13.2 % (ref 11.7–15.4)
WBC: 8.3 10*3/uL (ref 3.4–10.8)

## 2023-11-05 LAB — VITAMIN D 25 HYDROXY (VIT D DEFICIENCY, FRACTURES): Vit D, 25-Hydroxy: 32.4 ng/mL (ref 30.0–100.0)

## 2023-11-05 NOTE — Progress Notes (Signed)
 Lab results show stable and normal blood counts and kidney functions. Her vitamin is within normal ranges. Continue plan as discussed.

## 2023-11-06 NOTE — Progress Notes (Signed)
 Patient has been informed.

## 2023-11-11 ENCOUNTER — Other Ambulatory Visit: Payer: Self-pay

## 2023-11-11 MED ORDER — LEVOTHYROXINE SODIUM 150 MCG PO TABS: 150.0000 ug | ORAL_TABLET | Freq: Every day | ORAL | 1 refills | Status: DC

## 2023-11-20 ENCOUNTER — Ambulatory Visit: Admitting: Student

## 2023-11-20 VITALS — BP 130/82 | HR 95 | Temp 98.3°F | Resp 18 | Ht 62.0 in | Wt 169.5 lb

## 2023-11-20 DIAGNOSIS — R600 Localized edema: Secondary | ICD-10-CM | POA: Insufficient documentation

## 2023-11-20 MED ORDER — FUROSEMIDE 20 MG PO TABS: ORAL_TABLET | ORAL | 0 refills | Status: DC

## 2023-11-20 NOTE — Patient Instructions (Addendum)
 STOP Zyprexa immediately.   You will take Lasix for 3 days for leg swelling.

## 2023-11-20 NOTE — Progress Notes (Signed)
 Acute Office Visit  Subjective:     Patient ID: Brandy  AVAREY Torres, female    DOB: [birth year], 78 y.o.   MRN: [id number]  Chief Complaint  Patient presents with   Leg Swelling    Rash on ankles    HPI  Subjective:    Brandy  [name] is a 78 year old female with history of MDD, fibromyalgia, and osteoarthritis. Pt reports starting Zyprexa  on 2/25 and began weaning off citalopram her last dose was 11/18/2023 Monday night. The very next morning after she experienced bilateral lower leg swelling.   On 3/12 she noticed a red pin point rash on both legs, she says her legs burning, tight, there is foot involvement where upon waking her feet are exteremly swollen. States it feels like something is in my veins, like a worm running up and down. Patient is wearing sandals for comfort as she is barley able to walk due to pain. She feeling breathless when talking.  Patient shows me her abdomen stating that she feels swollen around her abdomen also.  She denies chest pain, palpitations, dizziness, or shortness of breath.  No history of hypertension or DVTs.   Review of Systems  Constitutional: Negative.   Respiratory: Negative.    Cardiovascular:  Positive for leg swelling.  Gastrointestinal: Negative.   Genitourinary: Negative.   Musculoskeletal: Negative.   Skin:  Positive for rash.  Neurological: Negative.   Psychiatric/Behavioral:  Positive for depression.         Objective:    BP 130/82 (BP Location: Left Arm, Patient Position: Sitting)   Pulse 95   Temp 98.3 F (36.8 C)   Resp 18   Ht 5\' 2"  (1.575 m)   Wt 169 lb 8 oz (76.9 kg)   SpO2 98%   BMI 31.00 kg/m    Physical Exam Vitals reviewed.  Constitutional:      Appearance: Normal appearance. She is obese.  Cardiovascular:     Rate and Rhythm: Normal rate and regular rhythm.     Pulses: Normal pulses.     Heart sounds: Normal heart sounds. No murmur heard. Pulmonary:     Effort: Pulmonary effort is normal.     Breath  sounds: Normal breath sounds. No wheezing.  Abdominal:     General: Bowel sounds are normal. There is distension.     Palpations: Abdomen is soft.  Musculoskeletal:     Right lower leg: 1+ Pitting Edema present.     Left lower leg: 1+ Pitting Edema present.  Skin:    General: Skin is warm and dry.     Capillary Refill: Capillary refill takes less than 2 seconds.     Comments: Circumferential petechial rash in bilateral lower extremity.  No drainage, no ulceration, or wounds.  Neurological:     General: No focal deficit present.     Mental Status: She is oriented to person, place, and time.  Psychiatric:        Mood and Affect: Mood normal.        Behavior: Behavior normal.     No results found for any visits on 11/20/23.      Assessment & Plan:   Problem List Items Addressed This Visit     Lower extremity edema - Primary   Lower extremities with 1+ pitting edema with circumferential petechial rash around both ankles.  I suspect peripheral edema to be related to initiation of olanzapine .  Patient was instructed to stop olanzapine  immediately and take continue sertraline .  She will use 20 mg of Lasix  once daily for 3 days for edema. We discussed potential side effects and recommend that she return if her symptoms do not improve.  The patient verbalizes understanding.       Meds ordered this encounter  Medications   furosemide  (LASIX ) 20 MG tablet    Sig: Take 1 tablet by mouth for 3 days for swelling.    Dispense:  3 tablet    Refill:  0    Return if symptoms worsen or fail to improve.  Nasario Badder, NP

## 2023-11-21 NOTE — Assessment & Plan Note (Signed)
 Lower extremities with 1+ pitting edema with circumferential petechial rash around both ankles.  I suspect peripheral edema to be related to initiation of olanzapine.  Patient was instructed to stop olanzapine immediately and take continue sertraline.  She will use 20 mg of Lasix once daily for 3 days for edema. We discussed potential side effects and recommend that she return if her symptoms do not improve.  The patient verbalizes understanding.

## 2023-11-25 ENCOUNTER — Ambulatory Visit: Admitting: Student

## 2023-11-25 ENCOUNTER — Encounter: Payer: Self-pay | Admitting: Student

## 2023-11-25 VITALS — BP 142/84 | HR 88 | Temp 97.8°F | Resp 18 | Ht 62.0 in | Wt 168.2 lb

## 2023-11-25 DIAGNOSIS — R0602 Shortness of breath: Secondary | ICD-10-CM | POA: Insufficient documentation

## 2023-11-25 DIAGNOSIS — R21 Rash and other nonspecific skin eruption: Secondary | ICD-10-CM | POA: Insufficient documentation

## 2023-11-25 HISTORY — DX: Shortness of breath: R06.02

## 2023-11-25 LAB — POCT URINALYSIS DIPSTICK
Glucose, UA: NEGATIVE
Leukocytes, UA: NEGATIVE
Nitrite, UA: NEGATIVE
Protein, UA: POSITIVE — AB
Spec Grav, UA: 1.025 (ref 1.010–1.025)
Urobilinogen, UA: 1 U/dL
pH, UA: 6 (ref 5.0–8.0)

## 2023-11-25 MED ORDER — PREDNISONE 10 MG PO TABS: ORAL_TABLET | ORAL | 0 refills | Status: DC

## 2023-11-25 MED ORDER — METHYLPREDNISOLONE 4 MG PO TBPK: ORAL_TABLET | ORAL | 0 refills | Status: DC

## 2023-11-25 NOTE — Patient Instructions (Addendum)
 I have sent in prednisone 10 mg tablet to take once a day for 5 days. This should help with your rash. Monitor the area. You can use OTC hydrocortisone cream for symptoms relief.   Start taking zyrtec once daily for allergy relief.   I have sent your urine off to the lab for further evaluation. We will call you with your lab results.

## 2023-11-25 NOTE — Assessment & Plan Note (Addendum)
 Complaints of exertional dyspnea. On assessment lung sounds are clear with no wheezes, rales, or crackles, no respiratory distress or tachypnea. There is +1 pitting edema to the left lower extremity and non pitting edema of the right. This could be residual from peripheral edema r/t antipsychotic olanzapine. No medication changes at this time.    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  -Urine dipstick showed positive protein and moderate blood with trace ketones and small bilirubin. Send for microscopic reflex.  Collect BNP and Albumin  -Echocardiogram 04/2023 left ventricular function 60 to 65%, there were some abnormalities.she has not followed by cardiology , I am placing a referral.   Instructions were provided to the patient for when to seek out emergency medical attention.  She verbalizes understanding.

## 2023-11-25 NOTE — Assessment & Plan Note (Signed)
 Patient's previous rash on bilateral ankles has resolved, she now has a macular rash to both shoulders around the clavicle area.  It is unclear the nature of the rash.  She did stop olanzapine and continue sertraline 25 mg.  She will continue sertraline and   She will take a short course of steroid prednisone for the inflammation.   -prednisone 10 mg x 5 days.  -Avoid irritants avoid known allergic irritants, start taking cetirizine once daily. -Keep the area dry and avoid scratching. -use topical hydrocortisone for those areas.

## 2023-11-25 NOTE — Progress Notes (Signed)
 Acute Office Visit  Subjective:     Patient ID: Brandy Torres, female    DOB: 1946-05-27, 79 y.o.   MRN: [id number]  Chief Complaint  Patient presents with   Follow-up    Pt reports a rash on her neck, feet, and shoulders.    HPI  [name] is a 78 year old female returning for reports of a new rash on her neck, feet, and shoulders.  She is accompanied by her son today, she reports rash to her neck and shoulders described as burning and stinging.  I saw her on 11/20/2023 for lower extremity edema and rash around bilateral ankle after the initiation of olanzapine.  The rash on her ankles have resolved, a few days later she notes this a different rash on her shoulder and neck.  She stopped olanzapine immediately and completed 3 days of Lasix for edema.  She has not made any changes in laundry detergent, perfumes, and no known exposure to irritants.   Patient reports breathlessness when talking that increases to shortness of breath with exertion.  This is unchanged since last week where she experienced the same.  She has history of asthma, GERD, anxiety.  She reports some wheezing that resolved on its own, but says she still feels winded with walking or standing for long amounts of time. She is ambulatory with cane and denies cough, orthopnea, dyspnea, chest pain, or palpitations.  Again , patient has noticed a decrease in swelling around her abdomen and lower extremities after using Lasix for 3 days but states the swelling has not completely resolved.  She is not using compression but has been elevating her legs.      Review of Systems  Constitutional: Negative.   HENT: Negative.    Eyes: Negative.   Respiratory: Negative.    Cardiovascular:  Positive for leg swelling. Negative for chest pain, palpitations, orthopnea and PND.  Gastrointestinal: Negative.   Genitourinary: Negative.   Musculoskeletal: Negative.   Skin:  Positive for rash.  Neurological: Negative.    Endo/Heme/Allergies: Negative.   Psychiatric/Behavioral:  The patient is nervous/anxious.         Objective:    BP (!) 142/84 (BP Location: Left Arm, Patient Position: Sitting, Cuff Size: Normal)   Pulse 88   Temp 97.8 F (36.6 C) (Temporal)   Resp 18   Ht 5\' 2"  (1.575 m)   Wt 168 lb 3.2 oz (76.3 kg)   SpO2 98%   BMI 30.76 kg/m    Physical Exam Vitals reviewed.  Constitutional:      Appearance: She is obese.  Cardiovascular:     Rate and Rhythm: Normal rate and regular rhythm.     Pulses:          Dorsalis pedis pulses are 2+ on the right side and 2+ on the left side.     Heart sounds: Normal heart sounds.  Pulmonary:     Effort: Pulmonary effort is normal.     Breath sounds: Normal breath sounds. No stridor. No wheezing, rhonchi or rales.  Abdominal:     General: Bowel sounds are normal. There is no distension.     Palpations: Abdomen is soft.     Tenderness: There is no abdominal tenderness. There is no guarding.  Musculoskeletal:     Right lower leg: Edema present.     Left lower leg: 1+ Edema present.  Skin:    General: Skin is warm.     Capillary Refill: Capillary refill  takes less than 2 seconds.     Findings: Rash present. Rash is macular.     Comments: Bilateral shoulder macular rash  Neurological:     General: No focal deficit present.     Mental Status: She is alert and oriented to person, place, and time.  Psychiatric:        Mood and Affect: Mood normal.        Behavior: Behavior normal.     Results for orders placed or performed in visit on 11/25/23  POCT urinalysis dipstick  Result Value Ref Range   Color, UA yellow    Clarity, UA clear    Glucose, UA Negative Negative   Bilirubin, UA small    Ketones, UA trace    Spec Grav, UA 1.025 1.010 - 1.025   Blood, UA moderate    pH, UA 6.0 5.0 - 8.0   Protein, UA Positive (A) Negative   Urobilinogen, UA 1.0 0.2 or 1.0 E.U./dL   Nitrite, UA neg    Leukocytes, UA Negative Negative   Appearance  clear    Odor yes         Assessment & Plan:   Problem List Items Addressed This Visit     Rash - Primary   Patient's previous rash on bilateral ankles has resolved, she now has a macular rash to both shoulders around the clavicle area.  It is unclear the nature of the rash.  She did stop olanzapine and continue sertraline 25 mg.  She will continue sertraline and   She will take a short course of steroid prednisone for the inflammation.   -prednisone 10 mg x 5 days.  -Avoid irritants avoid known allergic irritants, start taking cetirizine once daily. -Keep the area dry and avoid scratching. -use topical hydrocortisone for those areas.      Relevant Orders   Brain natriuretic peptide   Shortness of breath   Complaints of exertional dyspnea. On assessment lung sounds are clear with no wheezes, rales, or crackles, no respiratory distress or tachypnea. There is +1 pitting edema to the left lower extremity and non pitting edema of the right. This could be residual from peripheral edema r/t antipsychotic olanzapine. No medication changes at this time.    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  -Urine dipstick showed positive protein and moderate blood with trace ketones and small bilirubin. Send for microscopic reflex.  Collect BNP and Albumin  -Echocardiogram 04/2023 left ventricular function 60 to 65%, there were some abnormalities.she has not followed by cardiology , I am placing a referral.   Instructions were provided to the patient for when to seek out emergency medical attention.  She verbalizes understanding.      Relevant Orders   EKG 12-Lead   Brain natriuretic peptide   Albumin   POCT urinalysis dipstick (Completed)   Urinalysis, Routine w reflex microscopic   Ambulatory referral to Cardiology    Meds ordered this encounter   Medications   DISCONTD: methylPREDNISolone (MEDROL DOSEPAK) 4 MG TBPK tablet    Sig: Take as directed    Dispense:  21 tablet    Refill:  0   predniSONE (DELTASONE) 10 MG tablet    Sig: Take 1 tablet by mouth for 5 days for rash    Dispense:  5 tablet    Refill:  0    Return if symptoms worsen or fail to improve.  Edwena Blow, NP

## 2023-11-26 LAB — URINALYSIS, ROUTINE W REFLEX MICROSCOPIC
Glucose, UA: NEGATIVE
Nitrite, UA: NEGATIVE
RBC, UA: NEGATIVE
Specific Gravity, UA: 1.024 (ref 1.005–1.030)
Urobilinogen, Ur: 1 mg/dL (ref 0.2–1.0)
pH, UA: 6 (ref 5.0–7.5)

## 2023-11-26 LAB — MICROSCOPIC EXAMINATION
Bacteria, UA: NONE SEEN
Casts: NONE SEEN /LPF
RBC, Urine: NONE SEEN /HPF (ref 0–2)
WBC, UA: NONE SEEN /HPF (ref 0–5)

## 2023-11-26 LAB — ALBUMIN: Albumin: 4.5 g/dL (ref 3.8–4.8)

## 2023-11-26 LAB — BRAIN NATRIURETIC PEPTIDE: BNP: 60.6 pg/mL (ref 0.0–100.0)

## 2023-11-28 NOTE — Progress Notes (Signed)
 Urinalysis is negative for infection.  There is some protein and leukocytes, we will continue to monitor.  BNP level is normal, no evidence of fluid retention around the heart.

## 2023-12-02 ENCOUNTER — Ambulatory Visit: Payer: Medicare Other | Admitting: Student

## 2023-12-03 ENCOUNTER — Encounter: Payer: Self-pay | Admitting: Cardiology

## 2023-12-03 ENCOUNTER — Ambulatory Visit: Attending: Cardiology | Admitting: Cardiology

## 2023-12-03 VITALS — BP 128/84 | HR 89 | Ht 61.0 in | Wt 163.4 lb

## 2023-12-03 DIAGNOSIS — F323 Major depressive disorder, single episode, severe with psychotic features: Secondary | ICD-10-CM | POA: Diagnosis not present

## 2023-12-03 DIAGNOSIS — E7800 Pure hypercholesterolemia, unspecified: Secondary | ICD-10-CM

## 2023-12-03 DIAGNOSIS — M7989 Other specified soft tissue disorders: Secondary | ICD-10-CM | POA: Insufficient documentation

## 2023-12-03 DIAGNOSIS — R0602 Shortness of breath: Secondary | ICD-10-CM

## 2023-12-03 NOTE — Progress Notes (Signed)
 Cardiology Consultation:    Date:  12/03/2023   ID:  Brandy Torres, DOB 07-24-1946, MRN [id number]  PCP:  Edwena Blow, NP  Cardiologist:  Gypsy Balsam, MD   Referring MD: Edwena Blow, NP   Chief Complaint  Patient presents with   Shortness of Breath    History of Present Illness:    Brandy Torres is a 78 y.o. female who is being seen today for the evaluation of shortness of breath at the request of Leak, [name], NP.  Past medical history significant for essential hypertension to be managed with amlodipine initially 5 mg in September increase dose to 10 mg.  Recently she had some changes in her anxiety medication.  It look like she developed some allergic reaction to one of the medication that led to significant swelling of lower extremities.  That medication has been withdrawn she was put on Lasix for 3 days with improvement however still swelling was still there eventually she ended up being on prednisone which seems to be helping swelling is only 1+ if at all right now.  However, she complained of having shortness of breath this is a chronic complaint she had a long time ago lately however clearly became worse.  Last summer she was in the hospital because of chest pain, stress test negative, echocardiogram showed normal left ventricular ejection fraction.  She said she is try to walk around a little bit but she is getting tired quite easily.  Denies having recent exertional chest pain tightness squeezing pressure burning chest.  She does not smoke never did she does have a history of postpartum depression that led to hospitalization and she required multiple electroconvulsive therapy.  Past Medical History:  Diagnosis Date   Acid reflux    Allergic rhinoconjunctivitis    Allergy    Anemia    Anxiety    Asthma    AV nodal re-entry tachycardia [facility])    Cardiac Ablation   Cataract    Chronic headaches    Colon polyps    Depression    Duodenal ulcer    Fibromyalgia     GERD (gastroesophageal reflux disease)    High blood pressure    High cholesterol    Hypothyroidism    IBS (irritable bowel syndrome)    Lymphoma (HCC)    multiple and benign per patient    Osteoarthritis    Osteoporosis    Radiculopathy, lumbar region 11/04/2023   UTI (urinary tract infection)     Past Surgical History:  Procedure Laterality Date   ABLATION  2000   Cardiac ablation   ADENOIDECTOMY     BLADDER SURGERY  2002   Bladder Tack and Sling   CATARACT EXTRACTION  2016   COLONOSCOPY  12/14/2009   Mild sigmoid diverticulosis. Small internal hemorrhoids. Otherwise normal colonoscopy to TI.    ESOPHAGOGASTRODUODENOSCOPY  11/10/2012   Small hiatal hernia. Gastric polyps (status post polypectomy x2) with slightly increased size of the gastric polyps as compared to the previous EGD.    KNEE ARTHROSCOPY Right 2011   THYROIDECTOMY  1984   TONSILLECTOMY     age 1   TUBAL LIGATION  1982   UPPER GASTROINTESTINAL ENDOSCOPY      Current Medications: Current Meds  Medication Sig   Acetaminophen (TYLENOL PO) Take 650 mg by mouth as needed (pain).   amLODipine (NORVASC) 10 MG tablet Take 10 mg by mouth daily.   azelastine (ASTELIN) 0.1 % nasal spray Place 2  sprays into both nostrils 2 (two) times daily. Use in each nostril as directed   busPIRone (BUSPAR) 10 MG tablet Take 10 mg by mouth 3 (three) times daily.   Calcium Carb-Cholecalciferol 600-800 MG-UNIT TABS Take 1 tablet by mouth 2 (two) times daily.   esomeprazole (NEXIUM) 20 MG capsule Take 40 mg by mouth daily at 12 noon.   FIBER PO Take 1 tablet by mouth as needed (GI homeostatsis).   Glucosamine HCl 1500 MG TABS Take 1 tablet by mouth daily.   levothyroxine (SYNTHROID) 150 MCG tablet Take 1 tablet (150 mcg total) by mouth daily.   Methylsulfonylmethane (MSM) 1500 MG TABS Take 1 tablet by mouth 2 (two) times daily.   Multiple Vitamins-Minerals (MULTIVITAMIN ADULT PO) Take 1 tablet by mouth daily.   rosuvastatin  (CRESTOR) 40 MG tablet Take 40 mg by mouth daily.   sertraline (ZOLOFT) 25 MG tablet Take 2 tablets (50 mg total) by mouth daily.   [DISCONTINUED] predniSONE (DELTASONE) 10 MG tablet Take 1 tablet by mouth for 5 days for rash (Patient taking differently: Take 10 mg by mouth See admin instructions. Take 1 tablet by mouth for 5 days for rash)     Allergies:   Avelox [moxifloxacin hcl in nacl], Benzoxonium chloride [benzoxonium], Celebrex [celecoxib], Dexilant [dexlansoprazole], Doxycycline, Olanzapine, Penicillins, Prevacid [lansoprazole], Singulair [montelukast sodium], and Zoloft [sertraline]   Social History   Socioeconomic History   Marital status: Married    Spouse name: Not on file   Number of children: 1   Years of education: Not on file   Highest education level: Not on file  Occupational History   Occupation: Retired   Tobacco Use   Smoking status: Never   Smokeless tobacco: Never  Vaping Use   Vaping status: Never Used  Substance and Sexual Activity   Alcohol use: Never   Drug use: Never   Sexual activity: Not on file  Other Topics Concern   Not on file  Social History Narrative   Not on file   Social Drivers of Health   Financial Resource Strain: Not on file  Food Insecurity: Not on file  Transportation Needs: Not on file  Physical Activity: Not on file  Stress: Not on file  Social Connections: Not on file     Family History: The patient's family history includes Allergic rhinitis in her son; Breast cancer in her maternal aunt and maternal grandmother; COPD in her father; Colonic polyp in her mother; Coronary artery disease in her father; Diabetes in her sister; Dystonia in her son; Paget's disease of bone in her maternal aunt; Pancreatic cancer in her paternal uncle and paternal uncle; Rheum arthritis in her sister and son; Stomach cancer in her paternal aunt. There is no history of Colon cancer, Esophageal cancer, Rectal cancer, or Thyroid disease. ROS:   Please  see the history of present illness.    All 14 point review of systems negative except as described per history of present illness.  EKGs/Labs/Other Studies Reviewed:    The following studies were reviewed today: Echocardiogram from last year reviewed showing preserved ejection fraction no significant valvular pathology  EKG:  EKG Interpretation Date/Time:  Tuesday December 03 2023 14:20:56 EDT Ventricular Rate:  89 PR Interval:  182 QRS Duration:  74 QT Interval:  356 QTC Calculation: 433 R Axis:   12  Text Interpretation: Sinus rhythm with occasional Premature ventricular complexes T wave abnormality, consider inferior ischemia Abnormal ECG When compared with ECG of 16-Jun-2010 09:16, Premature ventricular complexes  are now Present Criteria for Inferior infarct are no longer Present Nonspecific T wave abnormality now evident in Anterolateral leads Confirmed by Gypsy Balsam [id number]) on 12/03/2023 2:35:52 PM    Recent Labs: 11/04/2023: ALT 20; BUN 18; Creatinine, Ser 0.59; Hemoglobin 12.8; Platelets 193; Potassium 3.7; Sodium 141 11/25/2023: BNP 60.6  Recent Lipid Panel No results found for: "CHOL", "TRIG", "HDL", "CHOLHDL", "VLDL", "LDLCALC", "LDLDIRECT"  Physical Exam:    VS:  BP 128/84 (BP Location: Right Arm, Patient Position: Sitting)   Pulse 89   Ht 5\' 1"  (1.549 m)   Wt 163 lb 6.4 oz (74.1 kg)   SpO2 98%   BMI 30.87 kg/m     Wt Readings from Last 3 Encounters:  12/03/23 163 lb 6.4 oz (74.1 kg)  11/25/23 168 lb 3.2 oz (76.3 kg)  11/20/23 169 lb 8 oz (76.9 kg)     GEN:  Well nourished, well developed in no acute distress HEENT: Normal NECK: No JVD; No carotid bruits LYMPHATICS: No lymphadenopathy CARDIAC: RRR, no murmurs, no rubs, no gallops RESPIRATORY:  Clear to auscultation without rales, wheezing or rhonchi  ABDOMEN: Soft, non-tender, non-distended MUSCULOSKELETAL:  No edema; No deformity  SKIN: Warm and dry NEUROLOGIC:  Alert and oriented x 3 PSYCHIATRIC:   Normal affect   ASSESSMENT:    1. SOB (shortness of breath)   2. Leg swelling   3. MDD (major depressive disorder), single episode, severe with psychosis (HCC)   4. High cholesterol   5. Shortness of breath    PLAN:    In order of problems listed above:  Shortness of breath with swelling legs, proBNP normal done by primary care physician.  I will ask her to have echocardiogram to assess left ventricle ejection fraction.  I suspect swollen legs are related to some medication she got as well as amlodipine that she has been taking today swelling is barely barely visible but still present acceptable for somebody was taking amlodipine.  She is off diuretic right now.  In terms of shortness of breath again echocardiogram will be done, probably multifactorial I do not see any indication for coronary artery disease activation right now but will reconsider in the future she may require some additional testing to look into it. Major depressive disorder with some anxiety.  They being treated by primary care physician we had a long discussion about this she may benefit from therapy as well. Dyslipidemia I did review K PN which show me her LDL of 64 HDL 76.  And she is taking right now Crestor 40 which I continue.    Medication Adjustments/Labs and Tests Ordered: Current medicines are reviewed at length with the patient today.  Concerns regarding medicines are outlined above.  Orders Placed This Encounter  Procedures   EKG 12-Lead   No orders of the defined types were placed in this encounter.   Signed, Georgeanna Lea, MD, [facility]. 12/03/2023 3:05 PM    [facility]

## 2023-12-03 NOTE — Patient Instructions (Signed)
 Medication Instructions:  Your physician recommends that you continue on your current medications as directed. Please refer to the Current Medication list given to you today.  *If you need a refill on your cardiac medications before your next appointment, please call your pharmacy*   Lab Work: None Ordered If you have labs (blood work) drawn today and your tests are completely normal, you will receive your results only by: MyChart Message (if you have MyChart) OR A paper copy in the mail If you have any lab test that is abnormal or we need to change your treatment, we will call you to review the results.   Testing/Procedures: Your physician has requested that you have an echocardiogram. Echocardiography is a painless test that uses sound waves to create images of your heart. It provides your doctor with information about the size and shape of your heart and how well your heart's chambers and valves are working. This procedure takes approximately one hour. There are no restrictions for this procedure. Please do NOT wear cologne, perfume, aftershave, or lotions (deodorant is allowed). Please arrive 15 minutes prior to your appointment time.  Please note: We ask at that you not bring children with you during ultrasound (echo/ vascular) testing. Due to room size and safety concerns, children are not allowed in the ultrasound rooms during exams. Our front office staff cannot provide observation of children in our lobby area while testing is being conducted. An adult accompanying a patient to their appointment will only be allowed in the ultrasound room at the discretion of the ultrasound technician under special circumstances. We apologize for any inconvenience.    Follow-Up: At [facility], you and your health needs are our priority.  As part of our continuing mission to provide you with exceptional heart care, we have created designated Provider Care Teams.  These Care Teams include your  primary Cardiologist (physician) and Advanced Practice Providers (APPs -  Physician Assistants and Nurse Practitioners) who all work together to provide you with the care you need, when you need it.  We recommend signing up for the patient portal called "MyChart".  Sign up information is provided on this After Visit Summary.  MyChart is used to connect with patients for Virtual Visits (Telemedicine).  Patients are able to view lab/test results, encounter notes, upcoming appointments, etc.  Non-urgent messages can be sent to your provider as well.   To learn more about what you can do with MyChart, go to [email or web address].    Your next appointment:   6 week(s)  The format for your next appointment:   In Person  Provider:   Gypsy Balsam, MD    Other Instructions NA

## 2023-12-03 NOTE — Addendum Note (Signed)
 Addended by: Baldo Ash D on: 12/03/2023 03:10 PM   Modules accepted: Orders

## 2023-12-05 ENCOUNTER — Ambulatory Visit: Attending: Cardiology

## 2023-12-05 DIAGNOSIS — R0602 Shortness of breath: Secondary | ICD-10-CM

## 2023-12-06 LAB — ECHOCARDIOGRAM COMPLETE
AR max vel: 1.77 cm2
AV Mean grad: 8 mmHg
AV Peak grad: 20.8 mmHg
Ao pk vel: 2.28 m/s
P 1/2 time: 457 ms
S' Lateral: 2.8 cm

## 2023-12-09 ENCOUNTER — Ambulatory Visit: Admitting: Student

## 2023-12-09 ENCOUNTER — Encounter: Payer: Self-pay | Admitting: Student

## 2023-12-09 VITALS — BP 148/80 | HR 86 | Temp 97.6°F | Resp 18 | Ht 62.0 in | Wt 159.8 lb

## 2023-12-09 DIAGNOSIS — F419 Anxiety disorder, unspecified: Secondary | ICD-10-CM

## 2023-12-09 DIAGNOSIS — F323 Major depressive disorder, single episode, severe with psychotic features: Secondary | ICD-10-CM | POA: Diagnosis not present

## 2023-12-09 MED ORDER — AMLODIPINE BESYLATE 10 MG PO TABS: 10.0000 mg | ORAL_TABLET | Freq: Every day | ORAL | 1 refills | Status: DC

## 2023-12-09 MED ORDER — ALPRAZOLAM 0.25 MG PO TABS: 0.2500 mg | ORAL_TABLET | Freq: Two times a day (BID) | ORAL | 0 refills | Status: DC | PRN

## 2023-12-09 MED ORDER — SERTRALINE HCL 100 MG PO TABS: 100.0000 mg | ORAL_TABLET | Freq: Every day | ORAL | 0 refills | Status: DC

## 2023-12-09 MED ORDER — BUSPIRONE HCL 10 MG PO TABS: 10.0000 mg | ORAL_TABLET | Freq: Three times a day (TID) | ORAL | 0 refills | Status: DC

## 2023-12-09 MED ORDER — ROSUVASTATIN CALCIUM 40 MG PO TABS: 40.0000 mg | ORAL_TABLET | Freq: Every day | ORAL | 1 refills | Status: DC

## 2023-12-09 NOTE — Progress Notes (Signed)
 Established Patient Office Visit  Subjective   Patient ID: Brandy Torres, female    DOB: 03/07/1946  Age: 78 y.o. MRN: [id number]  Chief Complaint  Patient presents with   Follow-up    1 Month F/U for depression.    HPI  [name] is a 78 year old female who returns for 1 month follow up of depression.   The patient presented with worsening symptoms of depression despite being on sertraline (Zoloft) 50mg  once daily for the past five weeks. The patient reported feeling restless, anxious, experiencing panic attacks three to four times a day, and feelings of hopelessness and worthlessness. The patient also experienced breathlessness during panic attacks but denied heart-related symptoms or chest pain. She reports mild relief of anxiety with use of buspirone 10 mg, she is currently using this 30 mg total daily. The patient previously had an allergic reaction to Zyprexa, resulting in swelling and was evaluated by a cardiologist who determined the swelling was not heart-related and possibly due to amlodipine. The patient has been under significant stress due to personal losses and is the primary caregiver for a husband with mild dementia and a grandson with autism. The patient expressed a willingness to try an increased dose of sertraline to see if it provides relief.     Review of Systems  Constitutional: Negative.   HENT: Negative.    Eyes: Negative.   Respiratory: Negative.    Cardiovascular: Negative.   Gastrointestinal: Negative.   Genitourinary: Negative.   Musculoskeletal:  Positive for back pain.  Skin: Negative.   Neurological: Negative.   Endo/Heme/Allergies: Negative.   Psychiatric/Behavioral:  Positive for depression. Negative for hallucinations, substance abuse and suicidal ideas. The patient is nervous/anxious.       Objective:     BP (!) 148/80   Pulse 86   Temp 97.6 F (36.4 C) (Temporal)   Resp 18   Ht 5\' 2"  (1.575 m)   Wt 159 lb 12.8 oz (72.5 kg)    SpO2 97%   BMI 29.23 kg/m    Physical Exam Vitals reviewed.  Constitutional:      Appearance: Normal appearance. She is obese.  Cardiovascular:     Rate and Rhythm: Normal rate and regular rhythm.     Pulses: Normal pulses.     Heart sounds: Normal heart sounds. No murmur heard. Pulmonary:     Effort: Pulmonary effort is normal. No respiratory distress.     Breath sounds: Normal breath sounds. No stridor. No wheezing or rhonchi.  Abdominal:     General: Abdomen is flat. Bowel sounds are normal. There is no distension.     Palpations: Abdomen is soft.  Musculoskeletal:        General: Normal range of motion.     Right lower leg: No edema.     Left lower leg: No edema.  Skin:    General: Skin is warm and dry.     Capillary Refill: Capillary refill takes less than 2 seconds.  Neurological:     General: No focal deficit present.     Mental Status: She is alert and oriented to person, place, and time.     Motor: No weakness.     Gait: Gait normal.  Psychiatric:        Attention and Perception: Attention and perception normal. She does not perceive auditory or visual hallucinations.        Mood and Affect: Mood is anxious and depressed.  Speech: Speech normal.        Behavior: Behavior normal. Behavior is cooperative.        Thought Content: Thought content normal. Thought content is not paranoid or delusional. Thought content does not include homicidal ideation. Thought content does not include homicidal or suicidal plan.        Cognition and Memory: Cognition and memory normal.        Judgment: Judgment normal.      Assessment & Plan:   Problem List Items Addressed This Visit     MDD (major depressive disorder), single episode, severe with psychosis (HCC) - Primary   Depression: The patient reported no improvement with the current sertraline dose and continued experiencing significant depressive symptoms. Considering the stressors and the patient's history, it is  likely the depression is exacerbated by situational factors. Increasing the sertraline dose may provide additional relief.  - Increased sertraline to 100mg  once daily in the morning. - Continued buspirone 10mg  three times daily.  Provided information on crisis hotlines and local mental health resources. Scheduled follow-up in three months or sooner if symptoms worsen. Encouraged to monitor symptoms and contact the office if there are any concerns before the scheduled follow-up.      Relevant Medications   sertraline (ZOLOFT) 100 MG tablet   busPIRone (BUSPAR) 10 MG tablet   ALPRAZolam (XANAX) 0.25 MG tablet   Anxiety   Anxiety and Panic Attacks: The patient experienced multiple daily panic attacks with associated breathlessness. The use of buspirone has provided partial relief. The addition of alprazolam for acute anxiety symptoms may help manage breakthrough panic attacks.  - Added alprazolam 0.25mg  BID as needed for panic attacks. -continue Buspirone 10 mg   Patient Education: - Advised on the risk of dependence with alprazolam and instructed to take it only as needed. - Provided information on crisis hotlines and local mental health resources.  I have personally reviewed the [place] recipient query regarding this patient and do not see any irregularities or concerning findings.  Controlled Substance Discussion: Discussed risk of controlled substances including possible unintentional overdose, especially if mixed with alcohol or other pills; typical speech given including illegal to share, always keep in the original bottle, safeguard medicine, do NOT mix with alcohol, other pain pills, "nerve" or anxiety pills, or sleeping pills; I am not obligated to approve of early refill or give new prescription if medicine is lost, stolen, or destroyed even with a police report, etc.; patient agrees with plan.        Relevant Medications   sertraline (ZOLOFT) 100 MG tablet   busPIRone (BUSPAR)  10 MG tablet   ALPRAZolam (XANAX) 0.25 MG tablet    Return in about 3 months (around 03/09/2024) for follow up of depression/anxiety .    Edwena Blow, NP

## 2023-12-09 NOTE — Assessment & Plan Note (Signed)
 Anxiety and Panic Attacks: The patient experienced multiple daily panic attacks with associated breathlessness. The use of buspirone has provided partial relief. The addition of alprazolam for acute anxiety symptoms may help manage breakthrough panic attacks.  - Added alprazolam 0.25mg  BID as needed for panic attacks. -continue Buspirone 10 mg   Patient Education: - Advised on the risk of dependence with alprazolam and instructed to take it only as needed. - Provided information on crisis hotlines and local mental health resources.  I have personally reviewed the [place] recipient query regarding this patient and do not see any irregularities or concerning findings.  Controlled Substance Discussion: Discussed risk of controlled substances including possible unintentional overdose, especially if mixed with alcohol or other pills; typical speech given including illegal to share, always keep in the original bottle, safeguard medicine, do NOT mix with alcohol, other pain pills, "nerve" or anxiety pills, or sleeping pills; I am not obligated to approve of early refill or give new prescription if medicine is lost, stolen, or destroyed even with a police report, etc.; patient agrees with plan.

## 2023-12-09 NOTE — Assessment & Plan Note (Signed)
 Depression: The patient reported no improvement with the current sertraline dose and continued experiencing significant depressive symptoms. Considering the stressors and the patient's history, it is likely the depression is exacerbated by situational factors. Increasing the sertraline dose may provide additional relief.  - Increased sertraline to 100mg  once daily in the morning. - Continued buspirone 10mg  three times daily.  Provided information on crisis hotlines and local mental health resources. Scheduled follow-up in three months or sooner if symptoms worsen. Encouraged to monitor symptoms and contact the office if there are any concerns before the scheduled follow-up.

## 2023-12-09 NOTE — Patient Instructions (Addendum)
Call or Text the Lifeline anytime, 24/7 at  988. We can all help prevent suicide. The 59 Lifeline provides 24/7, free and confidential support for people in distress, prevention and crisis resources for you or your loved ones, and best practices for professionals in the [place].  HandlingCost.fr  If you have any further questions regarding Crisis Services within [facility], please contact [phone].

## 2023-12-11 ENCOUNTER — Telehealth: Payer: Self-pay

## 2023-12-11 NOTE — Telephone Encounter (Signed)
Echo Results reviewed with pt's spouse per DPR as per Dr. Krasowski's note.  Spouse verbalized understanding and had no additional questions. Routed to PCP 

## 2024-01-07 ENCOUNTER — Other Ambulatory Visit: Payer: Self-pay | Admitting: Student

## 2024-01-15 ENCOUNTER — Ambulatory Visit: Admitting: Cardiology

## 2024-01-16 ENCOUNTER — Encounter: Payer: Self-pay | Admitting: Cardiology

## 2024-01-16 ENCOUNTER — Other Ambulatory Visit: Payer: Self-pay | Admitting: Student

## 2024-01-16 ENCOUNTER — Ambulatory Visit: Attending: Cardiology | Admitting: Cardiology

## 2024-01-16 VITALS — BP 130/84 | HR 85 | Ht 62.0 in | Wt 153.6 lb

## 2024-01-16 DIAGNOSIS — F419 Anxiety disorder, unspecified: Secondary | ICD-10-CM | POA: Diagnosis not present

## 2024-01-16 DIAGNOSIS — E890 Postprocedural hypothyroidism: Secondary | ICD-10-CM | POA: Diagnosis not present

## 2024-01-16 DIAGNOSIS — R0602 Shortness of breath: Secondary | ICD-10-CM

## 2024-01-16 DIAGNOSIS — I4719 Other supraventricular tachycardia: Secondary | ICD-10-CM | POA: Diagnosis not present

## 2024-01-16 NOTE — Patient Instructions (Signed)
 Medication Instructions:  Your physician recommends that you continue on your current medications as directed. Please refer to the Current Medication list given to you today.  *If you need a refill on your cardiac medications before your next appointment, please call your pharmacy*  Lab Work: NONE If you have labs (blood work) drawn today and your tests are completely normal, you will receive your results only by: MyChart Message (if you have MyChart) OR A paper copy in the mail If you have any lab test that is abnormal or we need to change your treatment, we will call you to review the results.  Testing/Procedures: NONE  Follow-Up: At [facility], you and your health needs are our priority.  As part of our continuing mission to provide you with exceptional heart care, our providers are all part of one team.  This team includes your primary Cardiologist (physician) and Advanced Practice Providers or APPs (Physician Assistants and Nurse Practitioners) who all work together to provide you with the care you need, when you need it.  Your next appointment:   6 month(s)  Provider:   Gypsy Balsam, MD    We recommend signing up for the patient portal called "MyChart".  Sign up information is provided on this After Visit Summary.  MyChart is used to connect with patients for Virtual Visits (Telemedicine).  Patients are able to view lab/test results, encounter notes, upcoming appointments, etc.  Non-urgent messages can be sent to your provider as well.   To learn more about what you can do with MyChart, go to [email or web address].   Other Instructions

## 2024-01-16 NOTE — Progress Notes (Signed)
 Cardiology Office Note:    Date:  01/16/2024   ID:  Brandy Torres  Brandy Torres, DOB December 13, 1945, MRN [id number]  PCP:  Nasario Badder, NP  Cardiologist:  Ralene Burger, MD    Referring MD: Nasario Badder, NP   Chief Complaint  Patient presents with   Follow-up   Medication Management    History of Present Illness:    Brandy  [name] is a 78 y.o. female with past medical history significant for shortness of breath, essential hypertension, anxiety she was referred to us  because of swollen legs as well as shortness of breath.  Cardiac evaluation included stress test that she had done last year which was negative, recently she had echocardiogram done which showed preserved left ventricle ejection fraction without significant valvular pathology.  Since I seen her last she is doing much better apparently was put on some psychiatric medication she thinks all her problems including shortness of breath swollen legs were related to it.  Overall swelling is dramatically improved and she is doing well.  She denies have any cardiac complaints  Past Medical History:  Diagnosis Date   Acid reflux    Allergic rhinoconjunctivitis    Allergy    Anemia    Anxiety    Asthma    AV nodal re-entry tachycardia [facility])    Cardiac Ablation   Cataract    Chronic headaches    Colon polyps    Depression    Duodenal ulcer    Fibromyalgia    GERD (gastroesophageal reflux disease)    High blood pressure    High cholesterol    Hypothyroidism    IBS (irritable bowel syndrome)    Lymphoma (HCC)    multiple and benign per patient    Osteoarthritis    Osteoporosis    Radiculopathy, lumbar region 11/04/2023   UTI (urinary tract infection)     Past Surgical History:  Procedure Laterality Date   ABLATION  2000   Cardiac ablation   ADENOIDECTOMY     BLADDER SURGERY  2002   Bladder Tack and Sling   CATARACT EXTRACTION  2016   COLONOSCOPY  12/14/2009   Mild sigmoid diverticulosis. Small internal hemorrhoids.  Otherwise normal colonoscopy to TI.    ESOPHAGOGASTRODUODENOSCOPY  11/10/2012   Small hiatal hernia. Gastric polyps (status post polypectomy x2) with slightly increased size of the gastric polyps as compared to the previous EGD.    KNEE ARTHROSCOPY Right 2011   THYROIDECTOMY  1984   TONSILLECTOMY     age 58   TUBAL LIGATION  1982   UPPER GASTROINTESTINAL ENDOSCOPY      Current Medications: Current Meds  Medication Sig   Acetaminophen (TYLENOL PO) Take 650 mg by mouth as needed (pain).   ALPRAZolam  (XANAX ) 0.25 MG tablet Take 1 tablet (0.25 mg total) by mouth 2 (two) times daily as needed for anxiety.   amLODipine  (NORVASC ) 10 MG tablet Take 1 tablet (10 mg total) by mouth daily.   azelastine  (ASTELIN ) 0.1 % nasal spray Place 2 sprays into both nostrils 2 (two) times daily. Use in each nostril as directed   busPIRone  (BUSPAR ) 10 MG tablet TAKE 1 TABLET BY MOUTH THREE TIMES DAILY   Calcium  Carb-Cholecalciferol 600-800 MG-UNIT TABS Take 1 tablet by mouth 2 (two) times daily.   esomeprazole (NEXIUM) 20 MG capsule Take 40 mg by mouth daily at 12 noon.   FIBER PO Take 1 tablet by mouth as needed (GI homeostatsis).   Glucosamine HCl 1500 MG TABS Take 1  tablet by mouth daily.   levothyroxine  (SYNTHROID ) 150 MCG tablet Take 1 tablet by mouth once daily   Methylsulfonylmethane (MSM) 1500 MG TABS Take 1 tablet by mouth 2 (two) times daily.   Multiple Vitamins-Minerals (MULTIVITAMIN ADULT PO) Take 1 tablet by mouth daily.   rosuvastatin  (CRESTOR ) 40 MG tablet Take 1 tablet (40 mg total) by mouth daily.   sertraline  (ZOLOFT ) 100 MG tablet Take 1 tablet (100 mg total) by mouth daily.     Allergies:   Olanzapine , Avelox [moxifloxacin hcl in nacl], Benzoxonium chloride [benzoxonium], Celebrex [celecoxib], Dexilant  [dexlansoprazole ], Doxycycline , Penicillins, Prevacid [lansoprazole], and Singulair  [montelukast  sodium]   Social History   Socioeconomic History   Marital status: Married    Spouse  name: Not on file   Number of children: 1   Years of education: Not on file   Highest education level: Not on file  Occupational History   Occupation: Retired   Tobacco Use   Smoking status: Never   Smokeless tobacco: Never  Vaping Use   Vaping status: Never Used  Substance and Sexual Activity   Alcohol use: Never   Drug use: Never   Sexual activity: Not on file  Other Topics Concern   Not on file  Social History Narrative   Not on file   Social Drivers of Health   Financial Resource Strain: Not on file  Food Insecurity: Not on file  Transportation Needs: Not on file  Physical Activity: Not on file  Stress: Not on file  Social Connections: Not on file     Family History: The patient's family history includes Allergic rhinitis in her son; Breast cancer in her maternal aunt and maternal grandmother; COPD in her father; Colonic polyp in her mother; Coronary artery disease in her father; Diabetes in her sister; Dystonia in her son; Paget's disease of bone in her maternal aunt; Pancreatic cancer in her paternal uncle and paternal uncle; Rheum arthritis in her sister and son; Stomach cancer in her paternal aunt. There is no history of Colon cancer, Esophageal cancer, Rectal cancer, or Thyroid  disease. ROS:   Please see the history of present illness.    All 14 point review of systems negative except as described per history of present illness  EKGs/Labs/Other Studies Reviewed:         Recent Labs: 11/04/2023: ALT 20; BUN 18; Creatinine, Ser 0.59; Hemoglobin 12.8; Platelets 193; Potassium 3.7; Sodium 141 11/25/2023: BNP 60.6  Recent Lipid Panel No results found for: "CHOL", "TRIG", "HDL", "CHOLHDL", "VLDL", "LDLCALC", "LDLDIRECT"  Physical Exam:    VS:  BP 130/84 (BP Location: Right Arm, Patient Position: Sitting)   Pulse 85   Ht 5\' 2"  (1.575 m)   Wt 153 lb 9.6 oz (69.7 kg)   SpO2 97%   BMI 28.09 kg/m     Wt Readings from Last 3 Encounters:  01/16/24 153 lb 9.6 oz  (69.7 kg)  12/09/23 159 lb 12.8 oz (72.5 kg)  12/03/23 163 lb 6.4 oz (74.1 kg)     GEN:  Well nourished, well developed in no acute distress HEENT: Normal NECK: No JVD; No carotid bruits LYMPHATICS: No lymphadenopathy CARDIAC: RRR, no murmurs, no rubs, no gallops RESPIRATORY:  Clear to auscultation without rales, wheezing or rhonchi  ABDOMEN: Soft, non-tender, non-distended MUSCULOSKELETAL:  No edema; No deformity  SKIN: Warm and dry LOWER EXTREMITIES: 1+ swelling NEUROLOGIC:  Alert and oriented x 3 PSYCHIATRIC:  Normal affect   ASSESSMENT:    1. Shortness of breath   2.  Anxiety   3. AV nodal re-entry tachycardia (HCC)   4. Postoperative hypothyroidism    PLAN:    In order of problems listed above:  Shortness of breath probably multifactorial but does not look like heart issue play significant role here.  She does have mild swelling of lower extremities which I think is related to amlodipine  but she is fine with this and she does not want to change the medication. Anxiety probably contributing factor follow-up by psychiatry. AV nodal reentry tachycardia denies having any palpitations. Hypothyroidism that being followed by internal medicine team Dyslipidemia I did review her blood work which showed HDL 76 total cholesterol 151.  Will continue present management   Medication Adjustments/Labs and Tests Ordered: Current medicines are reviewed at length with the patient today.  Concerns regarding medicines are outlined above.  No orders of the defined types were placed in this encounter.  Medication changes: No orders of the defined types were placed in this encounter.   Signed, Manfred Seed, MD, [facility] 01/16/2024 2:29 PM    [facility]

## 2024-01-29 ENCOUNTER — Ambulatory Visit: Admitting: Internal Medicine

## 2024-01-29 ENCOUNTER — Encounter: Payer: Self-pay | Admitting: Internal Medicine

## 2024-01-29 VITALS — BP 122/70 | HR 86 | Temp 98.2°F | Resp 16 | Ht 62.0 in | Wt 155.0 lb

## 2024-01-29 DIAGNOSIS — E890 Postprocedural hypothyroidism: Secondary | ICD-10-CM | POA: Diagnosis not present

## 2024-01-29 DIAGNOSIS — F411 Generalized anxiety disorder: Secondary | ICD-10-CM | POA: Insufficient documentation

## 2024-01-29 DIAGNOSIS — F3132 Bipolar disorder, current episode depressed, moderate: Secondary | ICD-10-CM | POA: Insufficient documentation

## 2024-01-29 DIAGNOSIS — E782 Mixed hyperlipidemia: Secondary | ICD-10-CM | POA: Insufficient documentation

## 2024-01-29 DIAGNOSIS — F410 Panic disorder [episodic paroxysmal anxiety] without agoraphobia: Secondary | ICD-10-CM | POA: Insufficient documentation

## 2024-01-29 MED ORDER — ALPRAZOLAM 0.5 MG PO TABS: 0.5000 mg | ORAL_TABLET | Freq: Two times a day (BID) | ORAL | 0 refills | Status: AC | PRN

## 2024-01-29 MED ORDER — BUSPIRONE HCL 15 MG PO TABS: 15.0000 mg | ORAL_TABLET | Freq: Three times a day (TID) | ORAL | 5 refills | Status: DC

## 2024-01-29 MED ORDER — DIVALPROEX SODIUM 500 MG PO DR TAB: 500.0000 mg | DELAYED_RELEASE_TABLET | Freq: Two times a day (BID) | ORAL | 2 refills | Status: DC

## 2024-01-29 NOTE — Progress Notes (Signed)
 Office Visit  Subjective   Patient ID: Brandy Torres   DOB: 04-30-1946   Age: 78 y.o.   MRN: [id number]   Chief Complaint Chief Complaint  Patient presents with   Follow-up     History of Present Illness The patient is a 78 year old female who returns for followup of her depression and anxiety.   She was previously followed by my NP who on her last visit 2 months ago increased her zoloft  from 50mg  to 100mg  daily.  She states that she was having worsening depression and anxiety on her last visit and she was having panic attacks every day.   She gets agitated and upset about things about her family.  She had manic depression post partum in 1973.  The patient has tried lexapro where this did not help.  She has tried citalopram, wellbutrin and prozac in the past.  The patient was on lithium in the 1970 up until 1980's.  She developed tardive dyskinesia and she stopped it.  She denies any mania at this time and is mostly depression with anxiety as described.  The patient is currently on zoloft  100mg  daily, buspar  10mg  TID, and xanax  0.25mg  BID prn.  She states she started the xanax  mid April but it does not help.  She states that the buspar  when she initially took it worked well.  She has been on abilifty and vraylar in the past but this made her aggressive.  She has been on seroquel in the past but she gained weight.  She reports extreme feelings of guilt, feelings of isolation, feelings of worthlessness, helpless feeling, fatigue, social withdrawal, and loss of interest in pleasurable activities.  She denies any problems with difficulty performing routine daily activities, insomnia, concentrating, fatigue, suicidal ideation, homicidal ideation, weight loss, loss of appetite, and no out of control feelings. This patient feels that she is able to care for herself. She currently lives with husband and her 32 yo grandson.  She has been admitted to a psychiatric facility twice in the past in 1972.  She  received 8 electroshock in the hospital.  The second hospitalization was in 1977.  She is not seeing a psychiatrist or therapist now.  She does not want to see either.     The patient is a 78 year old female who returns for a regularly scheduled thyroid  check.  The patient had XRT to the anterior neck in 1946.  She then had a thyroidectomy in 1984, for what proved on pathology to be a benign MNG.  The patient is currently on synthroid  150mcg daily for almost a year.  She claims to have no symptoms suggestive of thyroid  imbalance specifically denying fatigue, cold intolerance, heat intolerance, tremors, unexplained weight changes, and insomnia.   Brandy Torres also was diagnosed with mixed hyperlipidemia.  She states that in 2023 her TC and TG levels were elevated.  She states she was on lipitor for a while and they switched her to crestor .  She specifically denies abdominal pain, nausea, vomiting, diarrhea, myalgias, and fatigue. She remains on dietary management as well as crestor  40mg  at bedtime.  She is fasting in anticipation for labs today.        Past Medical History Past Medical History:  Diagnosis Date   Acid reflux    Allergic rhinoconjunctivitis    Allergy    Anemia    Anxiety    Asthma    AV nodal re-entry tachycardia [facility])    Cardiac Ablation  Cataract    Chronic headaches    Colon polyps    Depression    Duodenal ulcer    Fibromyalgia    GERD (gastroesophageal reflux disease)    High blood pressure    High cholesterol    Hypothyroidism    IBS (irritable bowel syndrome)    Lymphoma (HCC)    multiple and benign per patient    Osteoarthritis    Osteoporosis    Radiculopathy, lumbar region 11/04/2023   UTI (urinary tract infection)      Allergies Allergies  Allergen Reactions   Olanzapine  Swelling   Avelox [Moxifloxacin Hcl In Nacl] Swelling    Throat swelling.   Benzoxonium Chloride [Benzoxonium]     Eyes red, pain, itching   Celebrex [Celecoxib] Other (See  Comments)    Throat, mouth thickness   Dexilant  [Dexlansoprazole ] Other (See Comments)    Colitis   Doxycycline  Other (See Comments)    Mouth tingling Lip burning    Penicillins Other (See Comments)    Itching at injection site   Prevacid [Lansoprazole] Other (See Comments)    Colitis    Singulair  [Montelukast  Sodium] Other (See Comments)    Hair shedding     Medications  Current Outpatient Medications:    bismuth subsalicylate (PEPTO BISMOL) 262 MG/15ML suspension, Take 30 mLs by mouth every 6 (six) hours as needed for diarrhea or loose stools., Disp: , Rfl:    loperamide (IMODIUM) 2 MG capsule, Take 2 mg by mouth as needed for diarrhea or loose stools., Disp: , Rfl:    Acetaminophen (TYLENOL PO), Take 650 mg by mouth as needed (pain)., Disp: , Rfl:    amLODipine  (NORVASC ) 10 MG tablet, Take 1 tablet (10 mg total) by mouth daily., Disp: 90 tablet, Rfl: 1   azelastine  (ASTELIN ) 0.1 % nasal spray, Place 2 sprays into both nostrils 2 (two) times daily. Use in each nostril as directed, Disp: 30 mL, Rfl: 5   Calcium  Carb-Cholecalciferol 600-800 MG-UNIT TABS, Take 1 tablet by mouth 2 (two) times daily., Disp: , Rfl:    esomeprazole (NEXIUM) 20 MG capsule, Take 40 mg by mouth daily at 12 noon., Disp: , Rfl:    FIBER PO, Take 1 tablet by mouth as needed (GI homeostatsis)., Disp: , Rfl:    Glucosamine HCl 1500 MG TABS, Take 1 tablet by mouth daily., Disp: , Rfl:    levothyroxine  (SYNTHROID ) 150 MCG tablet, Take 1 tablet by mouth once daily, Disp: 30 tablet, Rfl: 0   Methylsulfonylmethane (MSM) 1500 MG TABS, Take 1 tablet by mouth 2 (two) times daily., Disp: , Rfl:    Multiple Vitamins-Minerals (MULTIVITAMIN ADULT PO), Take 1 tablet by mouth daily., Disp: , Rfl:    rosuvastatin  (CRESTOR ) 40 MG tablet, Take 1 tablet (40 mg total) by mouth daily., Disp: 90 tablet, Rfl: 1   sertraline  (ZOLOFT ) 100 MG tablet, Take 1 tablet (100 mg total) by mouth daily., Disp: 90 tablet, Rfl: 0   Review of  Systems Review of Systems  Constitutional:  Negative for chills, fever, malaise/fatigue and weight loss.  Eyes:  Negative for blurred vision and double vision.  Respiratory:  Negative for cough and shortness of breath.   Cardiovascular:  Negative for chest pain, palpitations and leg swelling.  Gastrointestinal:  Negative for abdominal pain, constipation, diarrhea, heartburn, nausea and vomiting.  Genitourinary:  Negative for frequency.  Musculoskeletal:  Negative for myalgias.  Skin:  Negative for itching and rash.  Neurological:  Negative for dizziness, weakness and headaches.  Endo/Heme/Allergies:  Negative for polydipsia.  Psychiatric/Behavioral:  Positive for depression. Negative for hallucinations, substance abuse and suicidal ideas.        Objective:    Vitals BP 122/70   Pulse 86   Temp 98.2 F (36.8 C)   Resp 16   Ht 5\' 2"  (1.575 m)   Wt 155 lb (70.3 kg)   SpO2 96%   BMI 28.35 kg/m    Physical Examination Physical Exam Constitutional:      Appearance: Normal appearance. She is not ill-appearing.  Neck:     Vascular: No carotid bruit.  Cardiovascular:     Rate and Rhythm: Normal rate and regular rhythm.     Pulses: Normal pulses.     Heart sounds: No murmur heard.    No friction rub. No gallop.  Pulmonary:     Effort: Pulmonary effort is normal. No respiratory distress.     Breath sounds: No wheezing, rhonchi or rales.  Abdominal:     General: Bowel sounds are normal. There is no distension.     Palpations: Abdomen is soft.     Tenderness: There is no abdominal tenderness.  Musculoskeletal:     Right lower leg: No edema.     Left lower leg: No edema.  Skin:    General: Skin is warm and dry.     Findings: No rash.  Neurological:     General: No focal deficit present.     Mental Status: She is alert and oriented to person, place, and time.  Psychiatric:        Mood and Affect: Mood normal.        Behavior: Behavior normal.        Assessment & Plan:    Hypothyroidism She has not had her thyroid  checked in a while.  We will check her TFT's today.  Bipolar affective disorder, currently depressed, moderate (HCC) She has bipolar disorder, not just major depression.  She is not having any mania but is depressed.  She has been on a number of medications in the past including lithium, abilify, seroquel and vraylar.  I am going to put her on a mood stablizer with depakote ER 500mg  BID and continue her sertraline  at 100mg  daily.  We will increase her buspar  from 10mg  TID to 15mg  TID for her anxiety and we will increase her xanax  to 0.5mg  po BID prn for panic attacks.  I will see her back in 2 weeks for followup.  GAD (generalized anxiety disorder) Plan as above.  Mixed hyperlipidemia We will check a FLP today since we are doing blood work.  Panic disorder Plan as above.    Return in about 3 months (around 04/30/2024) for annual.   Wayne Haines, MD

## 2024-01-29 NOTE — Assessment & Plan Note (Signed)
 Plan as above.

## 2024-01-29 NOTE — Assessment & Plan Note (Signed)
 She has bipolar disorder, not just major depression.  She is not having any mania but is depressed.  She has been on a number of medications in the past including lithium, abilify, seroquel and vraylar.  I am going to put her on a mood stablizer with depakote ER 500mg  BID and continue her sertraline  at 100mg  daily.  We will increase her buspar  from 10mg  TID to 15mg  TID for her anxiety and we will increase her xanax  to 0.5mg  po BID prn for panic attacks.  I will see her back in 2 weeks for followup.

## 2024-01-29 NOTE — Assessment & Plan Note (Signed)
 She has not had her thyroid  checked in a while.  We will check her TFT's today.

## 2024-01-29 NOTE — Assessment & Plan Note (Signed)
 We will check a FLP today since we are doing blood work.

## 2024-01-30 LAB — CMP14 + ANION GAP
ALT: 15 IU/L (ref 0–32)
AST: 21 IU/L (ref 0–40)
Albumin: 4.3 g/dL (ref 3.8–4.8)
Alkaline Phosphatase: 60 IU/L (ref 44–121)
Anion Gap: 19 mmol/L — ABNORMAL HIGH (ref 10.0–18.0)
BUN/Creatinine Ratio: 23 (ref 12–28)
BUN: 13 mg/dL (ref 8–27)
Bilirubin Total: 0.3 mg/dL (ref 0.0–1.2)
CO2: 22 mmol/L (ref 20–29)
Calcium: 9.4 mg/dL (ref 8.7–10.3)
Chloride: 102 mmol/L (ref 96–106)
Creatinine, Ser: 0.56 mg/dL — ABNORMAL LOW (ref 0.57–1.00)
Globulin, Total: 1.9 g/dL (ref 1.5–4.5)
Glucose: 89 mg/dL (ref 70–99)
Potassium: 3.1 mmol/L — ABNORMAL LOW (ref 3.5–5.2)
Sodium: 143 mmol/L (ref 134–144)
Total Protein: 6.2 g/dL (ref 6.0–8.5)
eGFR: 93 mL/min/{1.73_m2} (ref >59)

## 2024-01-30 LAB — LIPID PANEL
Chol/HDL Ratio: 2 ratio (ref 0.0–4.4)
Cholesterol, Total: 153 mg/dL (ref 100–199)
HDL: 76 mg/dL (ref >39)
LDL Chol Calc (NIH): 61 mg/dL (ref 0–99)
Triglycerides: 87 mg/dL (ref 0–149)
VLDL Cholesterol Cal: 16 mg/dL (ref 5–40)

## 2024-01-30 LAB — SPECIMEN STATUS REPORT

## 2024-01-30 LAB — T4, FREE: Free T4: 2.29 ng/dL — ABNORMAL HIGH (ref 0.82–1.77)

## 2024-01-30 LAB — TSH: TSH: 0.024 u[IU]/mL — ABNORMAL LOW (ref 0.450–4.500)

## 2024-01-31 ENCOUNTER — Ambulatory Visit: Payer: Self-pay

## 2024-01-31 ENCOUNTER — Other Ambulatory Visit: Payer: Self-pay

## 2024-01-31 MED ORDER — POTASSIUM CHLORIDE CRYS ER 20 MEQ PO TBCR: 20.0000 meq | EXTENDED_RELEASE_TABLET | Freq: Two times a day (BID) | ORAL | 0 refills | Status: DC

## 2024-01-31 MED ORDER — LEVOTHYROXINE SODIUM 125 MCG PO TABS: 150.0000 ug | ORAL_TABLET | Freq: Every day | ORAL | 2 refills | Status: DC

## 2024-01-31 NOTE — Progress Notes (Signed)
 Pt notified she has a few appts coming up and will keep them all.

## 2024-01-31 NOTE — Progress Notes (Signed)
 Patient called.  Patient aware.  Her K+ is too low. Send her a KCL 40mg  tab x 1. Her thyroid  is too high. Cut back her synthroid  to 125mcg po daily and make sure she has an appointment with me in 3 months to repeat this.

## 2024-02-12 ENCOUNTER — Encounter: Payer: Self-pay | Admitting: Internal Medicine

## 2024-02-12 ENCOUNTER — Ambulatory Visit: Admitting: Internal Medicine

## 2024-02-12 VITALS — BP 132/86 | HR 96 | Temp 98.0°F | Resp 18 | Ht 62.0 in | Wt 153.8 lb

## 2024-02-12 DIAGNOSIS — F3132 Bipolar disorder, current episode depressed, moderate: Secondary | ICD-10-CM

## 2024-02-12 DIAGNOSIS — R197 Diarrhea, unspecified: Secondary | ICD-10-CM | POA: Insufficient documentation

## 2024-02-12 MED ORDER — DIVALPROEX SODIUM ER 250 MG PO TB24: 250.0000 mg | ORAL_TABLET | Freq: Every evening | ORAL | 1 refills | Status: DC

## 2024-02-12 MED ORDER — SERTRALINE HCL 100 MG PO TABS: 150.0000 mg | ORAL_TABLET | Freq: Every day | ORAL | 1 refills | Status: DC

## 2024-02-12 NOTE — Assessment & Plan Note (Addendum)
 She states she was on 3 different antibiotics at the beginning of last month including a zpak, doxycycline  and bactrim for her teeth. She states they use these because they hurt her stomach the least.  I am going to to a stool leukocyte and culture and check a C diff on her.  She goes back to see her GI doctor on 02/21/2024.

## 2024-02-12 NOTE — Progress Notes (Signed)
 Office Visit  Subjective   Patient ID: Brandy Torres   DOB: 02-Jan-1946   Age: 78 y.o.   MRN: [id number]   Chief Complaint Chief Complaint  Patient presents with   Anxiety    Pt reports her anxiety has been getting worse since she started the Depakote , states she can not take it anymore.      History of Present Illness The patient is a 78 year old female who returns for followup of her depression and anxiety.   She was previously followed by my NP who on her last visit 2 months ago increased her zoloft  from 50mg  to 100mg  daily.  She states that she was having worsening depression and anxiety on her last visit and she was having panic attacks every day.   She gets agitated and upset about things about her family.  She had manic depression post partum in 1973.  The patient has tried lexapro where this did not help.  She has tried citalopram, wellbutrin and prozac in the past.  The patient was on lithium in the 1970 up until 1980's.  She developed tardive dyskinesia and she stopped it.  She denies any mania at this time and is mostly depression with anxiety as described.  The patient was on zoloft  100mg  daily, buspar  10mg  TID, and xanax  0.25mg  BID prn.  I saw her 2 weeks ago where we started her on depakote  ER 500mg  BID  and increased her buspar  to 15mg  TID.  I also increased her xanax  to 0.5mg  po BID prn but she states she has not really had to use the xanax .  She states that the depakote  is making her feel more anxious and more confused.   She has been on abilifty and vraylar in the past but this made her aggressive.  She has been on seroquel in the past but she gained weight.  She reports extreme feelings of guilt, feelings of isolation, feelings of worthlessness, helpless feeling, fatigue, social withdrawal, and loss of interest in pleasurable activities.  She denies any problems with difficulty performing routine daily activities, insomnia, concentrating, fatigue, suicidal ideation, homicidal  ideation, weight loss, loss of appetite, and no out of control feelings. This patient feels that she is able to care for herself. She currently lives with husband and her 47 yo grandson.  She has been admitted to a psychiatric facility twice in the past in 1972.  She received 8 electroshock in the hospital.  The second hospitalization was in 1977.  She is not seeing a psychiatrist or therapist now.  She does not want to see either.   The patient also states she was was on antibiotics for her teeth which was completed on 01/14/2024.  Soon after she developed diarrhea which has been ongoing.  She has a history of IBS where she states she is having explosive diarrhea 6 times per day.  She has been taking probiotics as well as immodium multiple times a day after her BM's and she is also on pepto bismol.  There is no blood or mucus in her diarrhea.       Past Medical History Past Medical History:  Diagnosis Date   Acid reflux    Allergic rhinoconjunctivitis    Allergy    Anemia    Anxiety    Asthma    AV nodal re-entry tachycardia [facility])    Cardiac Ablation   Cataract    Chronic headaches    Colon polyps    Depression  Duodenal ulcer    Fibromyalgia    GERD (gastroesophageal reflux disease)    High blood pressure    High cholesterol    Hypothyroidism    IBS (irritable bowel syndrome)    Lymphoma (HCC)    multiple and benign per patient    Osteoarthritis    Osteoporosis    Radiculopathy, lumbar region 11/04/2023   UTI (urinary tract infection)      Allergies Allergies  Allergen Reactions   Olanzapine  Swelling   Avelox [Moxifloxacin Hcl In Nacl] Swelling    Throat swelling.   Benzoxonium Chloride [Benzoxonium]     Eyes red, pain, itching   Celebrex [Celecoxib] Other (See Comments)    Throat, mouth thickness   Dexilant  [Dexlansoprazole ] Other (See Comments)    Colitis   Doxycycline  Other (See Comments)    Mouth tingling Lip burning    Penicillins Other (See Comments)     Itching at injection site   Prevacid [Lansoprazole] Other (See Comments)    Colitis    Singulair  [Montelukast  Sodium] Other (See Comments)    Hair shedding     Medications  Current Outpatient Medications:    Acetaminophen (TYLENOL PO), Take 650 mg by mouth as needed (pain)., Disp: , Rfl:    ALPRAZolam  (XANAX ) 0.5 MG tablet, Take 1 tablet (0.5 mg total) by mouth 2 (two) times daily as needed for up to 14 days for anxiety., Disp: 28 tablet, Rfl: 0   amLODipine  (NORVASC ) 10 MG tablet, Take 1 tablet (10 mg total) by mouth daily., Disp: 90 tablet, Rfl: 1   azelastine  (ASTELIN ) 0.1 % nasal spray, Place 2 sprays into both nostrils 2 (two) times daily. Use in each nostril as directed, Disp: 30 mL, Rfl: 5   bismuth subsalicylate (PEPTO BISMOL) 262 MG/15ML suspension, Take 30 mLs by mouth every 6 (six) hours as needed for diarrhea or loose stools., Disp: , Rfl:    busPIRone  (BUSPAR ) 15 MG tablet, Take 1 tablet (15 mg total) by mouth 3 (three) times daily., Disp: 90 tablet, Rfl: 5   Calcium  Carb-Cholecalciferol 600-800 MG-UNIT TABS, Take 1 tablet by mouth 2 (two) times daily., Disp: , Rfl:    esomeprazole (NEXIUM) 20 MG capsule, Take 40 mg by mouth daily at 12 noon., Disp: , Rfl:    FIBER PO, Take 1 tablet by mouth as needed (GI homeostatsis)., Disp: , Rfl:    Glucosamine HCl 1500 MG TABS, Take 1 tablet by mouth daily., Disp: , Rfl:    levothyroxine  (SYNTHROID ) 125 MCG tablet, Take 1 tablet (125 mcg total) by mouth daily., Disp: 30 tablet, Rfl: 2   loperamide (IMODIUM) 2 MG capsule, Take 2 mg by mouth as needed for diarrhea or loose stools., Disp: , Rfl:    Methylsulfonylmethane (MSM) 1500 MG TABS, Take 1 tablet by mouth 2 (two) times daily., Disp: , Rfl:    Multiple Vitamins-Minerals (MULTIVITAMIN ADULT PO), Take 1 tablet by mouth daily., Disp: , Rfl:    potassium chloride  SA (KLOR-CON  M) 20 MEQ tablet, Take 1 tablet (20 mEq total) by mouth 2 (two) times daily., Disp: 180 tablet, Rfl: 0    rosuvastatin  (CRESTOR ) 40 MG tablet, Take 1 tablet (40 mg total) by mouth daily., Disp: 90 tablet, Rfl: 1   Review of Systems Review of Systems  Constitutional:  Negative for chills and fever.  Eyes:  Negative for blurred vision.  Respiratory:  Negative for shortness of breath.   Cardiovascular:  Negative for chest pain and palpitations.  Gastrointestinal:  Positive for diarrhea.  Negative for abdominal pain, constipation, nausea and vomiting.  Neurological:  Negative for dizziness, weakness and headaches.       Objective:    Vitals BP 132/86   Pulse 96   Temp 98 F (36.7 C) (Oral)   Resp 18   Ht 5\' 2"  (1.575 m)   Wt 153 lb 12.8 oz (69.8 kg)   SpO2 97%   BMI 28.13 kg/m    Physical Examination Physical Exam Constitutional:      Appearance: Normal appearance. She is not ill-appearing.  Cardiovascular:     Rate and Rhythm: Normal rate and regular rhythm.     Pulses: Normal pulses.     Heart sounds: No murmur heard.    No friction rub. No gallop.  Pulmonary:     Effort: Pulmonary effort is normal. No respiratory distress.     Breath sounds: No wheezing, rhonchi or rales.  Abdominal:     General: Bowel sounds are normal. There is no distension.     Palpations: Abdomen is soft.     Tenderness: There is no abdominal tenderness.  Musculoskeletal:     Right lower leg: No edema.     Left lower leg: No edema.  Skin:    General: Skin is warm and dry.     Findings: No rash.  Neurological:     Mental Status: She is alert.        Assessment & Plan:   Diarrhea She states she was on 3 different antibiotics at the beginning of last month including a zpak, doxycycline  and bactrim for her teeth. She states they use these because they hurt her stomach the least.  I am going to to a stool leukocyte and culture and check a C diff on her.  She goes back to see her GI doctor on 02/21/2024.  Bipolar affective disorder, currently depressed, moderate (HCC) I think the depakote  is  causing an effect for her confusion and increased anxiousness.  I am going to cut her depakote  ER to 250mg  at night and increase her sertraline  from 100mg  to 150mg  daily and continue on buspar  15mg  TID and xanax  as needed.    Return in about 2 weeks (around 02/26/2024).   Wayne Haines, MD

## 2024-02-12 NOTE — Assessment & Plan Note (Signed)
 I think the depakote  is causing an effect for her confusion and increased anxiousness.  I am going to cut her depakote  ER to 250mg  at night and increase her sertraline  from 100mg  to 150mg  daily and continue on buspar  15mg  TID and xanax  as needed.

## 2024-02-14 ENCOUNTER — Ambulatory Visit: Admitting: Internal Medicine

## 2024-02-14 DIAGNOSIS — R197 Diarrhea, unspecified: Secondary | ICD-10-CM

## 2024-02-14 DIAGNOSIS — K589 Irritable bowel syndrome without diarrhea: Secondary | ICD-10-CM

## 2024-02-14 NOTE — Progress Notes (Signed)
 Nurse Visit Stool Cultures collected from 02/12/2024.

## 2024-02-18 LAB — STOOL CULTURE: E coli, Shiga toxin Assay: NEGATIVE

## 2024-02-18 LAB — C DIFFICILE TOXINS A+B W/RFLX: C difficile Toxins A+B, EIA: NEGATIVE

## 2024-02-18 LAB — FECAL LEUKOCYTES

## 2024-02-18 LAB — C DIFFICILE, CYTOTOXIN B

## 2024-02-19 ENCOUNTER — Telehealth: Payer: Self-pay | Admitting: Physician Assistant

## 2024-02-19 NOTE — Telephone Encounter (Signed)
 Good morning Brandy Torres  The following patient has an appointment scheduled for Friday at 1040am for diarrhea. She stated that she has had this issue over a month now and that it is explosive. She can't leave home because her bowels are leaky. She doesn't think she will be able to get here on Friday without messing her clothes up. She is asking that her appointment be a phone one. Please advise.

## 2024-02-20 ENCOUNTER — Other Ambulatory Visit: Payer: Self-pay | Admitting: Internal Medicine

## 2024-02-20 MED ORDER — COLESTIPOL HCL 1 G PO TABS: 1.0000 g | ORAL_TABLET | Freq: Two times a day (BID) | ORAL | 0 refills | Status: DC

## 2024-02-20 MED ORDER — DIPHENOXYLATE-ATROPINE 2.5-0.025 MG PO TABS
1.0000 | ORAL_TABLET | Freq: Three times a day (TID) | ORAL | 0 refills | Status: AC | PRN
Start: 2024-02-20 — End: 2025-02-19

## 2024-02-20 NOTE — Telephone Encounter (Signed)
 Hi Brandy Torres  I spoke with patient and gave her your recommendations. She wanted to know if she can be prescribed a medication to stop the diarrhea long enough for her  to get to the appointment. A medication that is not pepto or immodium. Please advise. Thank you.

## 2024-02-20 NOTE — Telephone Encounter (Signed)
 Attempted to reach patient multiple times on her home phone. Line goes straight to a fast busy signal. I have sent RX for Colestipol. MyChart message sent to patient since I am unable to reach her by phone at this time.

## 2024-02-20 NOTE — Progress Notes (Signed)
 02/21/2024 Brandy  THERSA Torres [id number] [birth year]  Referring provider: Wayne Haines, MD Primary GI doctor: Dr. Venice Gillis  ASSESSMENT AND PLAN:  History of lymphocytic colitis diagnosed 2020 treated with budesonide   and IBS mixed  now presents with 2 months of diarrhea, had black stools with pepto/imodium with out help, stool studies negative with PCP last week for Cdiff, stool culture Has some nausea, chills, no fever, no AB pain but has griping 2022 negative CRP, celiac panel 10/09/2018 colonoscopy with moderate predominantly sigmoid diverticulosis and small internal hemorrhoids otherwise normal.  Pathology showed lymphocytic colitis.  Patient given budesonide  9 mg p.o. daily x8 weeks, then 6 mg for 2 weeks then 3 mg for another 2 weeks.  Told to avoid NSAIDs and use Imodium as needed  Improvement with Benefiber and dicyclomine  has had some NSAIDS, had swtiched to SSRI and diarrhea started - will get KUB - add back on fiber -Reviewed the diagnosis, natural history, and treatment of lymphocytic colitis.   -I recommend budesonide  9 mg daily. We will use for this at least two months after bowel habits return to normal (with a goal of less than 3 bowel movements daily) and then taper to the lowest effective dose. -I recommended that the patient avoid all NSAIDs as this may exacerbate a flare.   -There is also some association of worsening symptoms with with proton pump inhibitors, H2 blockers, and SSRIs.  GERD has some AB pain, epigastric squeezing pain, negative cardiac work up, was on NSAIDs short term for tooth 2020 ultrasound abdomen multiple cysts throughout liver 10/09/2018 EGD with hiatal hernia multiple gastric polyps.   Pathology showed fundic gland polyps.  - check labs with CBC - increase nexium 40 twice a day, add on pepcid at night -alginate therapy given -Lifestyle changes discussed, avoid NSAIDS, ETOH, hand out given to the patient - some concern for obstruction will get  UGI, consider repeat EGD  Fibromyalgia  Patient Care Team: Wayne Haines, MD as PCP - General (Internal Medicine)  HISTORY OF PRESENT ILLNESS: 78 y.o. female with a past medical history listed below presents for evaluation of diarrhea.   Last seen in the office 03/2021 by Reginal Capra, PA for abdominal pain and IBS.  Discussed the use of AI scribe software for clinical note transcription with the patient, who gave verbal consent to proceed.  History of Present Illness   Brandy  [name] is a 78 year old female with lymphocytic colitis who presents with persistent diarrhea and abdominal discomfort.  She experiences persistent diarrhea multiple times daily and at night, accompanied by urgency and incontinence. The stool is light brown and resembles coffee grounds, turning black only with Pepto-Bismol use. Recent stool studies were negative for C. diff and Campylobacter, with a parasitic test pending due to a lab issue. She uses Pepto-Bismol and Imodium.  She has a history of lymphocytic colitis diagnosed during a colonoscopy in 2020, treated with budesonide . Current symptoms include bloating, gas, nausea, and occasional vomiting, especially when straining during bowel movements. She also experiences chills and feels cold at home, requiring multiple blankets.  She has a history of IBS diagnosed decades ago, initially presenting with constipation. Three years ago, she experienced stomach pain, inability to eat, and weight loss. She mentions a fall in November 2024, resulting in fractured vertebrae and chest pain, initially suspected as a heart attack. She has had chest squeezing pain twice in the past year.  She takes Nexium, initially started on Prilosec in 1995,  and Phillips probiotic. Missing a Nexium dose results in acid reflux. She has a paraesophageal hiatal hernia, contributing to vomiting episodes, especially when straining.  She underwent dental surgery in May 2025, using ibuprofen  for pain but stopped due to reflux exacerbation. She avoids alcohol and is aware of alcohol sugars' effects on her gastrointestinal symptoms.  Significant life changes in the past two to three years include her sister's illness and death, family changes, and personal injuries such as cutting a finger and falls causing vertebral compression fractures.  She is undergoing changes in antidepressant and anxiety medications since February 2025, suspecting these may contribute to her gastrointestinal issues. She does not regularly take Aleve, ibuprofen, Goody powders, or prescription pain medications.     She  reports that she has never smoked. She has never used smokeless tobacco. She reports that she does not drink alcohol and does not use drugs.  RELEVANT GI HISTORY, IMAGING AND LABS: Results   LABS Stool studies: Negative for Clostridioides difficile and Campylobacter (02/14/2024)  RADIOLOGY X-ray: Two new vertebral fractures (07/2023)  DIAGNOSTIC Colonoscopy: Lymphocytic colitis (2020) Endoscopy: Hiatal hernia, 6 cm (2020)      CBC    Component Value Date/Time   WBC 8.3 11/04/2023 1514   WBC 9.4 09/26/2018 1455   RBC 4.25 11/04/2023 1514   RBC 4.51 09/26/2018 1455   HGB 12.8 11/04/2023 1514   HCT 39.7 11/04/2023 1514   PLT 193 11/04/2023 1514   MCV 93 11/04/2023 1514   MCH 30.1 11/04/2023 1514   MCH 29.7 09/26/2018 1455   MCHC 32.2 11/04/2023 1514   MCHC 34.0 09/26/2018 1455   RDW 13.2 11/04/2023 1514   LYMPHSABS 3.0 11/04/2023 1514   EOSABS 0.2 11/04/2023 1514   BASOSABS 0.1 11/04/2023 1514   Recent Labs    11/04/23 1514  HGB 12.8    CMP     Component Value Date/Time   NA 143 01/29/2024 1627   K 3.1 (L) 01/29/2024 1627   CL 102 01/29/2024 1627   CO2 22 01/29/2024 1627   GLUCOSE 89 01/29/2024 1627   GLUCOSE 89 09/26/2018 1455   BUN 13 01/29/2024 1627   CREATININE 0.56 (L) 01/29/2024 1627   CREATININE 0.72 09/26/2018 1455   CALCIUM  9.4 01/29/2024 1627   PROT  6.2 01/29/2024 1627   ALBUMIN 4.3 01/29/2024 1627   AST 21 01/29/2024 1627   ALT 15 01/29/2024 1627   ALKPHOS 60 01/29/2024 1627   BILITOT 0.3 01/29/2024 1627      Latest Ref Rng & Units 01/29/2024    4:27 PM 11/25/2023   10:05 AM 11/04/2023    3:14 PM  Hepatic Function  Total Protein 6.0 - 8.5 g/dL 6.2   6.5   Albumin 3.8 - 4.8 g/dL 4.3  4.5  4.8   AST 0 - 40 IU/L 21   21   ALT 0 - 32 IU/L 15   20   Alk Phosphatase 44 - 121 IU/L 60   60   Total Bilirubin 0.0 - 1.2 mg/dL 0.3   0.4       Current Medications:   Current Outpatient Medications (Endocrine & Metabolic):    budesonide  (ENTOCORT EC ) 3 MG 24 hr capsule, Take 3 capsules (9 mg total) by mouth daily for 60 days, THEN 2 capsules (6 mg total) daily for 14 days, THEN 1 capsule (3 mg total) daily for 14 days.   levothyroxine  (SYNTHROID ) 125 MCG tablet, Take 1 tablet (125 mcg total) by mouth daily.  Current Outpatient Medications (Cardiovascular):    amLODipine  (NORVASC ) 10 MG tablet, Take 1 tablet (10 mg total) by mouth daily.   rosuvastatin  (CRESTOR ) 40 MG tablet, Take 1 tablet (40 mg total) by mouth daily.   colestipol  (COLESTID ) 1 g tablet, Take 1 tablet (1 g total) by mouth 2 (two) times daily. (Patient not taking: Reported on 02/21/2024)  Current Outpatient Medications (Respiratory):    azelastine  (ASTELIN ) 0.1 % nasal spray, Place 2 sprays into both nostrils 2 (two) times daily. Use in each nostril as directed  Current Outpatient Medications (Analgesics):    Acetaminophen (TYLENOL PO), Take 650 mg by mouth as needed (pain).   Current Outpatient Medications (Other):    bismuth subsalicylate (PEPTO BISMOL) 262 MG/15ML suspension, Take 30 mLs by mouth every 6 (six) hours as needed for diarrhea or loose stools.   busPIRone  (BUSPAR ) 15 MG tablet, Take 1 tablet (15 mg total) by mouth 3 (three) times daily.   Calcium  Carb-Cholecalciferol 600-800 MG-UNIT TABS, Take 1 tablet by mouth 2 (two) times daily.   diphenoxylate -atropine   (LOMOTIL ) 2.5-0.025 MG tablet, Take 1 tablet by mouth every 8 (eight) hours as needed for diarrhea or loose stools.   FIBER PO, Take 1 tablet by mouth as needed (GI homeostatsis).   Glucosamine HCl 1500 MG TABS, Take 1 tablet by mouth daily.   loperamide (IMODIUM) 2 MG capsule, Take 2 mg by mouth as needed for diarrhea or loose stools.   Methylsulfonylmethane (MSM) 1500 MG TABS, Take 1 tablet by mouth 2 (two) times daily.   Multiple Vitamins-Minerals (MULTIVITAMIN ADULT PO), Take 1 tablet by mouth daily.   potassium chloride  SA (KLOR-CON  M) 20 MEQ tablet, Take 1 tablet (20 mEq total) by mouth 2 (two) times daily.   sertraline  (ZOLOFT ) 100 MG tablet, Take 1.5 tablets (150 mg total) by mouth daily.   divalproex  (DEPAKOTE  ER) 250 MG 24 hr tablet, Take 1 tablet (250 mg total) by mouth at bedtime. (Patient not taking: Reported on 02/21/2024)   esomeprazole (NEXIUM) 40 MG capsule, Take 1 capsule (40 mg total) by mouth 2 (two) times daily before a meal.  Medical History:  Past Medical History:  Diagnosis Date   Acid reflux    Allergic rhinoconjunctivitis    Allergy    Anemia    Anxiety    Asthma    AV nodal re-entry tachycardia [facility])    Cardiac Ablation   Cataract    Chronic headaches    Colon polyps    Depression    Duodenal ulcer    Fibromyalgia    GERD (gastroesophageal reflux disease)    High blood pressure    High cholesterol    Hypothyroidism    IBS (irritable bowel syndrome)    Lymphoma (HCC)    multiple and benign per patient    Osteoarthritis    Osteoporosis    Radiculopathy, lumbar region 11/04/2023   UTI (urinary tract infection)    Allergies:  Allergies  Allergen Reactions   Olanzapine  Swelling   Avelox [Moxifloxacin Hcl In Nacl] Swelling    Throat swelling.   Benzoxonium Chloride [Benzoxonium]     Eyes red, pain, itching   Celebrex [Celecoxib] Other (See Comments)    Throat, mouth thickness   Depakote  [Divalproex  Sodium]     Trouble speaking, confusion    Dexilant  [Dexlansoprazole ] Other (See Comments)    Colitis   Doxycycline  Other (See Comments)    Mouth tingling Lip burning    Penicillins Other (See Comments)    Itching at  injection site   Prevacid [Lansoprazole] Other (See Comments)    Colitis    Singulair  [Montelukast  Sodium] Other (See Comments)    Hair shedding     Surgical History:  She  has a past surgical history that includes Tubal ligation (1982); Thyroidectomy (1984); Bladder surgery (2002); Knee arthroscopy (Right, 2011); Cataract extraction (2016); Tonsillectomy; Adenoidectomy; Ablation (2000); Esophagogastroduodenoscopy (11/10/2012); Colonoscopy (12/14/2009); and Upper gastrointestinal endoscopy. Family History:  Her family history includes Allergic rhinitis in her son; Breast cancer in her maternal aunt and maternal grandmother; COPD in her father; Colonic polyp in her mother; Coronary artery disease in her father; Diabetes in her sister; Dystonia in her son; Paget's disease of bone in her maternal aunt; Pancreatic cancer in her paternal uncle and paternal uncle; Rheum arthritis in her sister and son; Stomach cancer in her paternal aunt.  REVIEW OF SYSTEMS  : All other systems reviewed and negative except where noted in the History of Present Illness.  PHYSICAL EXAM: BP 118/82   Pulse 77   Ht 5' 2 (1.575 m)   Wt 156 lb 9.6 oz (71 kg)   BMI 28.64 kg/m  Physical Exam   GENERAL APPEARANCE: Well nourished, in no apparent distress. HEENT: No cervical lymphadenopathy, unremarkable thyroid , sclerae anicteric, conjunctiva pink. RESPIRATORY: Respiratory effort normal, breath sounds equal bilaterally without rales, rhonchi, or wheezing. CARDIO: Regular rate and rhythm with no murmurs, rubs, or gallops, peripheral pulses intact. ABDOMEN: Soft, non-distended, active bowel sounds in all four quadrants, sore to palpation with epigastric pain, no rebound, no mass appreciated. RECTAL: Declines. MUSCULOSKELETAL: Full range of  motion, normal gait, without edema. SKIN: Dry, intact without rashes or lesions. No jaundice. NEURO: Alert, oriented, no focal deficits. PSYCH: Cooperative, normal mood and affect.      Edmonia Gottron, PA-C 11:46 AM

## 2024-02-21 ENCOUNTER — Ambulatory Visit: Admitting: Physician Assistant

## 2024-02-21 ENCOUNTER — Other Ambulatory Visit (INDEPENDENT_AMBULATORY_CARE_PROVIDER_SITE_OTHER)

## 2024-02-21 ENCOUNTER — Ambulatory Visit (INDEPENDENT_AMBULATORY_CARE_PROVIDER_SITE_OTHER)
Admission: RE | Admit: 2024-02-21 | Discharge: 2024-02-21 | Disposition: A | Discharge status: Home / Self Care | Source: Ambulatory Visit | Attending: Physician Assistant | Admitting: Physician Assistant

## 2024-02-21 ENCOUNTER — Encounter: Payer: Self-pay | Admitting: Physician Assistant

## 2024-02-21 VITALS — BP 118/82 | HR 77 | Ht 62.0 in | Wt 156.6 lb

## 2024-02-21 DIAGNOSIS — R112 Nausea with vomiting, unspecified: Secondary | ICD-10-CM

## 2024-02-21 DIAGNOSIS — K52832 Lymphocytic colitis: Secondary | ICD-10-CM

## 2024-02-21 DIAGNOSIS — K449 Diaphragmatic hernia without obstruction or gangrene: Secondary | ICD-10-CM

## 2024-02-21 DIAGNOSIS — K582 Mixed irritable bowel syndrome: Secondary | ICD-10-CM

## 2024-02-21 DIAGNOSIS — R1013 Epigastric pain: Secondary | ICD-10-CM | POA: Diagnosis not present

## 2024-02-21 DIAGNOSIS — K219 Gastro-esophageal reflux disease without esophagitis: Secondary | ICD-10-CM

## 2024-02-21 DIAGNOSIS — R110 Nausea: Secondary | ICD-10-CM

## 2024-02-21 DIAGNOSIS — F3132 Bipolar disorder, current episode depressed, moderate: Secondary | ICD-10-CM

## 2024-02-21 LAB — CBC WITH DIFFERENTIAL/PLATELET
Basophils Absolute: 0 10*3/uL (ref 0.0–0.1)
Basophils Relative: 0.5 % (ref 0.0–3.0)
Eosinophils Absolute: 0 10*3/uL (ref 0.0–0.7)
Eosinophils Relative: 0.3 % (ref 0.0–5.0)
HCT: 40.2 % (ref 36.0–46.0)
Hemoglobin: 13.1 g/dL (ref 12.0–15.0)
Lymphocytes Relative: 27.1 % (ref 12.0–46.0)
Lymphs Abs: 2.2 10*3/uL (ref 0.7–4.0)
MCHC: 32.7 g/dL (ref 30.0–36.0)
MCV: 87.6 fl (ref 78.0–100.0)
Monocytes Absolute: 1 10*3/uL (ref 0.1–1.0)
Monocytes Relative: 12.1 % — ABNORMAL HIGH (ref 3.0–12.0)
Neutro Abs: 5 10*3/uL (ref 1.4–7.7)
Neutrophils Relative %: 60 % (ref 43.0–77.0)
Platelets: 211 10*3/uL (ref 150.0–400.0)
RBC: 4.58 Mil/uL (ref 3.87–5.11)
RDW: 14.9 % (ref 11.5–15.5)
WBC: 8.3 10*3/uL (ref 4.0–10.5)

## 2024-02-21 LAB — COMPREHENSIVE METABOLIC PANEL WITH GFR
ALT: 10 U/L (ref 0–35)
AST: 18 U/L (ref 0–37)
Albumin: 4 g/dL (ref 3.5–5.2)
Alkaline Phosphatase: 39 U/L (ref 39–117)
BUN: 12 mg/dL (ref 6–23)
CO2: 28 meq/L (ref 19–32)
Calcium: 9.2 mg/dL (ref 8.4–10.5)
Chloride: 105 meq/L (ref 96–112)
Creatinine, Ser: 0.68 mg/dL (ref 0.40–1.20)
GFR: 82.83 mL/min (ref >60.00)
Glucose, Bld: 91 mg/dL (ref 70–99)
Potassium: 4 meq/L (ref 3.5–5.1)
Sodium: 141 meq/L (ref 135–145)
Total Bilirubin: 0.3 mg/dL (ref 0.2–1.2)
Total Protein: 6.4 g/dL (ref 6.0–8.3)

## 2024-02-21 LAB — TSH: TSH: 1.33 u[IU]/mL (ref 0.35–5.50)

## 2024-02-21 LAB — SEDIMENTATION RATE: Sed Rate: 6 mm/h (ref 0–30)

## 2024-02-21 MED ORDER — ESOMEPRAZOLE MAGNESIUM 40 MG PO CPDR: 40.0000 mg | DELAYED_RELEASE_CAPSULE | Freq: Two times a day (BID) | ORAL | 3 refills | Status: DC

## 2024-02-21 MED ORDER — BUDESONIDE 3 MG PO CPEP: ORAL_CAPSULE | ORAL | 0 refills | Status: AC

## 2024-02-21 NOTE — Patient Instructions (Addendum)
 Your provider has requested that you go to the basement level for lab work before leaving today. Press B on the elevator. The lab is located at the first door on the left as you exit the elevator.  Your provider has requested that you have an abdominal x ray before leaving today. Please go to the basement floor to our Radiology department for the test.  Reflux Gourmet Rescue  It is an ALGINATE THERAPY which is the only intervention that works to safeguard the esophagus by creating a protective barrier that actually stops reflux from happening.  -The general directions for use are as stated on the packaging: Take 1 teaspoon (5 ml), or more as needed or as directed by your physician, after meals and before bed.  -These general directions address the most common times for reflux to occur, but our Rescue products may be taken anytime. Some individuals may take our product preemptively, when they know they will suffer from reflux, or as needed - when discomfort arises. (If taken around food, it should be consumed last.)  -You do not have to take 1 teaspoon (5 ml) of the product. While one teaspoon (5ml) may be the perfect average amount to relieve reflux suffering in some, others may require more or less. You may adjust the amount of Mint Chocolate Rescue and Vanilla Caramel Rescue to the lowest amount necessary to meet your individual needs to improve your quality of life.  -You may dilute the product if it is too viscous for you to consume. Keep in mind, however, that the thickness of the product was formulated to provide optimal coating and protection of your throat and esophagus. Though diluting the product is possible, it may reduce the protective function and/or length of action.  -This can be used in conjunction with reflux medications and lifestyle changes.  100% ALL-NATURAL  Paraben FREE, glycerin FREE, & potassium FREE  Made entirely from all-natural ingredients considered safe for children  and during pregnancy  No known side effects  All-natural flavor Gluten FREE  Allergen FREE  Vegan  Can find more information here: NameSeizer.co.nz   FIBER SUPPLEMENT You can do metamucil or fibercon once or twice a day but if this causes gas/bloating please switch to Benefiber or Citracel.   Fiber is good for constipation/diarrhea/irritable bowel syndrome.   It can also help with weight loss and can help lower your bad cholesterol (LDL).   Please do 1 TBSP in the morning in water, coffee, or tea.   It can take up to a month before you can see a difference with your bowel movements.   It is cheapest from [employer], [employer], [employer].     What Is Microscopic Colitis? Microscopic colitis is a condition that causes chronic, watery diarrhea. Unlike other types of colitis (like ulcerative colitis or Crohn's disease), the colon (large intestine) appears normal during a colonoscopy. The inflammation can only be seen under a microscope--hence the name.  There are two main types:  Lymphocytic colitis - increased white blood cells (lymphocytes) in the colon lining Collagenous colitis - thickened layer of collagen (a protein) in the colon lining  Common Symptoms  Ongoing watery diarrhea (often multiple times a day) Urgency to have a bowel movement Abdominal pain or cramping Bloating or gas Fatigue Weight loss (less common)  Causes and Risk Factors The exact cause isn't fully known, but possible contributing factors include:  Immune system reactions  Medications, such as: NSAIDs (e.g., ibuprofen) Proton pump inhibitors (e.g., omeprazole) SSRIs (e.g.,  sertraline ) Smoking Older age (most common in people over 76) Female sex (more common in women)   How Is It Diagnosed?  Colonoscopy: usually appears normal  Biopsy (tiny tissue sample from the colon) is needed to see inflammation under a microscope  Treatment Options Treatment depends on  how severe your symptoms are:  Lifestyle & Diet Changes  Avoid trigger foods (e.g., caffeine, dairy, fatty foods) Quit smoking Reduce alcohol Stay hydrated  Medications  Anti-diarrheal meds (like loperamide/Imodium) Budesonide  (a corticosteroid with fewer side effects, often the first choice for moderate-to-severe cases) Bismuth subsalicylate (e.g., Pepto-Bismol) Stopping or switching medications that may be triggering the condition   What's the Outlook?  Many people improve with treatment. The condition may come and go. It's not associated with colon cancer. Regular follow-up helps keep symptoms under control.  You have been scheduled for an Upper GI Series at [facility]. Your appointment is on 03/12/2024 at 11 am. Please arrive 30 minutes prior to your test for registration. Make sure not to eat or drink anything after midnight on the night before your test. If you need to reschedule, please call radiology at [phone]. ________________________________________________________________ An upper GI series uses x rays to help diagnose problems of the upper GI tract, which includes the esophagus, stomach, and duodenum. The duodenum is the first part of the small intestine. An upper GI series is conducted by a radiology technologist or a radiologist--a doctor who specializes in x-ray imaging--at a hospital or outpatient center. While sitting or standing in front of an x-ray machine, the patient drinks barium liquid, which is often white and has a chalky consistency and taste. The barium liquid coats the lining of the upper GI tract and makes signs of disease show up more clearly on x rays. X-ray video, called fluoroscopy, is used to view the barium liquid moving through the esophagus, stomach, and duodenum. Additional x rays and fluoroscopy are performed while the patient lies on an x-ray table. To fully coat the upper GI tract with barium liquid, the technologist or radiologist  may press on the abdomen or ask the patient to change position. Patients hold still in various positions, allowing the technologist or radiologist to take x rays of the upper GI tract at different angles. If a technologist conducts the upper GI series, a radiologist will later examine the images to look for problems.  This test typically takes about 1 hour to complete. __________________________________________________________________   Due to recent changes in healthcare laws, you may see the results of your imaging and laboratory studies on MyChart before your provider has had a chance to review them.  We understand that in some cases there may be results that are confusing or concerning to you. Not all laboratory results come back in the same time frame and the provider may be waiting for multiple results in order to interpret others.  Please give us  48 hours in order for your provider to thoroughly review all the results before contacting the office for clarification of your results.    I appreciate the  opportunity to care for you  Thank You   [facility]

## 2024-02-24 ENCOUNTER — Ambulatory Visit: Payer: Self-pay | Admitting: Physician Assistant

## 2024-02-26 ENCOUNTER — Encounter: Payer: Self-pay | Admitting: Internal Medicine

## 2024-02-26 ENCOUNTER — Ambulatory Visit: Admitting: Internal Medicine

## 2024-02-26 VITALS — BP 136/72 | HR 76 | Temp 97.5°F | Resp 18 | Ht 62.0 in

## 2024-02-26 DIAGNOSIS — K52832 Lymphocytic colitis: Secondary | ICD-10-CM

## 2024-02-26 DIAGNOSIS — F3132 Bipolar disorder, current episode depressed, moderate: Secondary | ICD-10-CM | POA: Diagnosis not present

## 2024-02-26 DIAGNOSIS — R519 Headache, unspecified: Secondary | ICD-10-CM

## 2024-02-26 DIAGNOSIS — G8929 Other chronic pain: Secondary | ICD-10-CM | POA: Diagnosis not present

## 2024-02-26 MED ORDER — ALPRAZOLAM ER 0.5 MG PO TB24
0.5000 mg | ORAL_TABLET | Freq: Every day | ORAL | 1 refills | Status: DC
Start: 2024-02-26 — End: 2024-03-27

## 2024-02-26 NOTE — Progress Notes (Signed)
 Office Visit  Subjective   Patient ID: Brandy  [name]   DOB: [birth year]   Age: 78 y.o.   MRN: [id number]   Chief Complaint Chief Complaint  Patient presents with   2 Week Follow-up     History of Present Illness The patient is a 78 year old female who returns for a 2 week followup of her depression and anxiety.   On her last visit, I felt she had bipolar affective disorder with depression and she was on depakote  which was causing confusion and increased anxiousness.  I cut her depakote  ER back to 250mg  at night (she was on depakote  ER 500mg  BID) and we also increased her sertraline  from 100mg  to 150mg  daily and continued her on buspar  15mg  TID and xanax  as needed.  She took Depakote  ER 250mg  daily for about a week but she still had the same symptoms so she therefore stopped it.  She has some dizziness which she did not feel was due to the buspar  which she has been on for almost a year.  She got dizziness after developing diarrhea as described below.  Again, she was previously followed by my NP who on her last visit 2 months ago increased her zoloft  from 50mg  to 100mg  daily.  She states that she was having worsening depression and anxiety on her last visit and she was having panic attacks every day.   She gets agitated and upset about things about her family.  She had manic depression post partum in 1973.  The patient has tried lexapro where this did not help.  She has tried citalopram, wellbutrin and prozac in the past.  The patient was on lithium in the 1970 up until 1980's.  She developed tardive dyskinesia and she stopped it.  She denies any mania at this time and is mostly depression with anxiety as described.  The patient was on zoloft  100mg  daily, buspar  10mg  TID, and xanax  0.25mg  BID prn.  I saw her 2 weeks ago where we started her on depakote  ER 500mg  BID  and increased her buspar  to 15mg  TID.  I also increased her xanax  to 0.5mg  po BID prn but she states she has not really had to use the  xanax .  She states that the depakote  is making her feel more anxious and more confused.   She has been on abilifty and vraylar in the past but this made her aggressive.  She has been on seroquel in the past but she gained weight.  She reports extreme feelings of guilt, feelings of isolation, feelings of worthlessness, helpless feeling, fatigue, social withdrawal, and loss of interest in pleasurable activities.  She denies any problems with difficulty performing routine daily activities, insomnia, concentrating, fatigue, suicidal ideation, homicidal ideation, weight loss, loss of appetite, and no out of control feelings. This patient feels that she is able to care for herself. She currently lives with husband and her 81 yo grandson.  She has been admitted to a psychiatric facility twice in the past in 1972.  She received 8 electroshock in the hospital.  The second hospitalization was in 1977.  She is not seeing a psychiatrist or therapist now.  She does not want to see either.  I also saw her 2 weeks ago where she was having diarrhea after being on 3 different antibiotics at the beginning of last month including a zpak, doxycycline  and bactrim for her teeth. She states they used these antibiotics because they hurt her stomach the least.  I did stool studies on her at that time and her stool culture was negative and her C. Diff was negative.  She went back to see her GI doctor on 02/21/2024 and per their notes she has a history of lymphocytic colitis where they felt she had a flare.  She was having persistent diarrhea and abdominal discomfort.  She has a history of lymphocytic colitis diagnosed during a colonoscopy in 2020, treated with budesonide . Her current symptoms include bloating, gas, nausea, and occasional vomiting, especially when straining during bowel movements.  GI thinks she has lymphocytic colitis and IBS mixed.  They started her budesonide  at that time. She mentioned she had some improvement with  Benefiber and dicyclomine .  They are obtaining a KUB and an UGI series as well.  They want her to add back fiber, continue budesonide , avoid NSAIDS and increase nexium 40mg  BID and add pepcid at night.  They wanted her to continue on probiotics and take immodium as needed.  They also warned that there was some association of worsening symptoms with with proton pump inhibitors, H2 blockers, and SSRIs with a history of lymphocytic colitis.    The patient also states she has had headaches throughout her life and this really started as a teenager.  She has never been diagnosed with migraines.  She states her headaches are cyclic.  She can go a month without headaches or she can have 3-4 a week or 2-3 a day.  Her headaches can last 20-40 minutes.  She denies any pain with the headaches but they come on with auras and she gets nausea and dizziness.  She has been told in the past she has ocular migraines and was told at that time there was no treatment.  Bright lights makes her symptoms worse but no noises.  She has not tried any OTC meds to make these symptoms go away. She will get vision loss and flashing lights when this occurs with Left eye worse than right.       Past Medical History Past Medical History:  Diagnosis Date   Acid reflux    Allergic rhinoconjunctivitis    Allergy    Anemia    Anxiety    Asthma    AV nodal re-entry tachycardia [facility])    Cardiac Ablation   Cataract    Chronic headaches    Colon polyps    Depression    Duodenal ulcer    Fibromyalgia    GERD (gastroesophageal reflux disease)    High blood pressure    High cholesterol    Hypothyroidism    IBS (irritable bowel syndrome)    Lymphoma (HCC)    multiple and benign per patient    Osteoarthritis    Osteoporosis    Radiculopathy, lumbar region 11/04/2023   UTI (urinary tract infection)      Allergies Allergies  Allergen Reactions   Olanzapine  Swelling   Avelox [Moxifloxacin Hcl In Nacl] Swelling    Throat  swelling.   Benzoxonium Chloride [Benzoxonium]     Eyes red, pain, itching   Celebrex [Celecoxib] Other (See Comments)    Throat, mouth thickness   Depakote  [Divalproex  Sodium]     Trouble speaking, confusion   Dexilant  [Dexlansoprazole ] Other (See Comments)    Colitis   Doxycycline  Other (See Comments)    Mouth tingling Lip burning    Penicillins Other (See Comments)    Itching at injection site   Prevacid [Lansoprazole] Other (See Comments)    Colitis  Singulair  [Montelukast  Sodium] Other (See Comments)    Hair shedding     Medications  Current Outpatient Medications:    Acetaminophen (TYLENOL PO), Take 650 mg by mouth as needed (pain)., Disp: , Rfl:    amLODipine  (NORVASC ) 10 MG tablet, Take 1 tablet (10 mg total) by mouth daily., Disp: 90 tablet, Rfl: 1   azelastine  (ASTELIN ) 0.1 % nasal spray, Place 2 sprays into both nostrils 2 (two) times daily. Use in each nostril as directed, Disp: 30 mL, Rfl: 5   bismuth subsalicylate (PEPTO BISMOL) 262 MG/15ML suspension, Take 30 mLs by mouth every 6 (six) hours as needed for diarrhea or loose stools., Disp: , Rfl:    budesonide  (ENTOCORT EC ) 3 MG 24 hr capsule, Take 3 capsules (9 mg total) by mouth daily for 60 days, THEN 2 capsules (6 mg total) daily for 14 days, THEN 1 capsule (3 mg total) daily for 14 days., Disp: 222 capsule, Rfl: 0   busPIRone  (BUSPAR ) 15 MG tablet, Take 1 tablet (15 mg total) by mouth 3 (three) times daily., Disp: 90 tablet, Rfl: 5   Calcium  Carb-Cholecalciferol 600-800 MG-UNIT TABS, Take 1 tablet by mouth 2 (two) times daily., Disp: , Rfl:    esomeprazole (NEXIUM) 40 MG capsule, Take 1 capsule (40 mg total) by mouth 2 (two) times daily before a meal., Disp: 60 capsule, Rfl: 3   FIBER PO, Take 1 tablet by mouth as needed (GI homeostatsis)., Disp: , Rfl:    Glucosamine HCl 1500 MG TABS, Take 1 tablet by mouth daily., Disp: , Rfl:    levothyroxine  (SYNTHROID ) 125 MCG tablet, Take 1 tablet (125 mcg total) by mouth  daily., Disp: 30 tablet, Rfl: 2   loperamide (IMODIUM) 2 MG capsule, Take 2 mg by mouth as needed for diarrhea or loose stools., Disp: , Rfl:    Methylsulfonylmethane (MSM) 1500 MG TABS, Take 1 tablet by mouth 2 (two) times daily., Disp: , Rfl:    Multiple Vitamins-Minerals (MULTIVITAMIN ADULT PO), Take 1 tablet by mouth daily., Disp: , Rfl:    potassium chloride  SA (KLOR-CON  M) 20 MEQ tablet, Take 1 tablet (20 mEq total) by mouth 2 (two) times daily., Disp: 180 tablet, Rfl: 0   rosuvastatin  (CRESTOR ) 40 MG tablet, Take 1 tablet (40 mg total) by mouth daily., Disp: 90 tablet, Rfl: 1   sertraline  (ZOLOFT ) 100 MG tablet, Take 1.5 tablets (150 mg total) by mouth daily., Disp: 135 tablet, Rfl: 1   Review of Systems Review of Systems  Constitutional:  Negative for chills and fever.  Eyes:  Positive for blurred vision. Negative for double vision.  Respiratory:  Negative for cough and shortness of breath.   Cardiovascular:  Negative for chest pain, palpitations and leg swelling.  Gastrointestinal:  Positive for diarrhea. Negative for abdominal pain, blood in stool, constipation, nausea and vomiting.  Genitourinary:  Negative for frequency.  Musculoskeletal:  Negative for myalgias.  Skin:  Negative for rash.  Neurological:  Positive for dizziness. Negative for weakness and headaches.  Endo/Heme/Allergies:  Positive for polydipsia.       Objective:    Vitals BP 136/72 (BP Location: Left Arm, Patient Position: Sitting)   Pulse 76   Temp (!) 97.5 F (36.4 C)   Resp 18   Ht 5' 2 (1.575 m)   SpO2 99%   BMI 28.64 kg/m    Physical Examination Physical Exam Constitutional:      Appearance: Normal appearance. She is not ill-appearing.   Cardiovascular:  Rate and Rhythm: Normal rate and regular rhythm.     Pulses: Normal pulses.     Heart sounds: No murmur heard.    No friction rub. No gallop.  Pulmonary:     Effort: Pulmonary effort is normal. No respiratory distress.     Breath  sounds: No wheezing, rhonchi or rales.  Abdominal:     General: Bowel sounds are normal. There is no distension.     Palpations: Abdomen is soft.     Tenderness: There is no abdominal tenderness.   Musculoskeletal:     Right lower leg: No edema.     Left lower leg: No edema.   Skin:    General: Skin is warm and dry.     Findings: No rash.   Neurological:     General: No focal deficit present.     Mental Status: She is alert and oriented to person, place, and time.   Psychiatric:        Mood and Affect: Mood normal.        Assessment & Plan:   Lymphocytic colitis I have reviewed her notes from GI.  I have given them a hand written instructions on what GI wants her to do including medicines and OTC medicines.  She is not dehydrated on my exam today.  We did orthostatics on her today due to some dizziness but her orthostatics were normal.    Bipolar affective disorder, currently depressed, moderate (HCC) I want her to stop the depakote .  We will cotninue her on sertraline  150mg  daily and see her back in 4 weeks when this medicine she be effective.  I want her to continue on xanax  as needed.  Chronic nonintractable headache She may be having ocular migraines. She requests to see neurology for evaluation.    Return in about 4 weeks (around 03/25/2024).   Wayne Haines, MD

## 2024-02-26 NOTE — Assessment & Plan Note (Signed)
 She may be having ocular migraines. She requests to see neurology for evaluation.

## 2024-02-26 NOTE — Assessment & Plan Note (Signed)
 I have reviewed her notes from GI.  I have given them a hand written instructions on what GI wants her to do including medicines and OTC medicines.  She is not dehydrated on my exam today.  We did orthostatics on her today due to some dizziness but her orthostatics were normal.

## 2024-02-26 NOTE — Assessment & Plan Note (Signed)
 I want her to stop the depakote .  We will cotninue her on sertraline  150mg  daily and see her back in 4 weeks when this medicine she be effective.  I want her to continue on xanax  as needed.

## 2024-02-27 NOTE — Telephone Encounter (Signed)
 Called patient for symptom update. She is taking 9 mg of budesonide  daily as prescribed & diarrhea is still frequent, however it is not as watery/explosive. This is day 5 of budesonide . She still needs to add on fiber supplement which she is going to pick up today. She felt a little weak yesterday, but is hydrating today with Pedialyte. PCP also evaluated her yesterday. She's unsure what son wanted included in chart, so message sent back to son to clarify.

## 2024-03-09 ENCOUNTER — Ambulatory Visit: Admitting: Student

## 2024-03-12 ENCOUNTER — Ambulatory Visit: Payer: Self-pay | Admitting: Physician Assistant

## 2024-03-12 ENCOUNTER — Other Ambulatory Visit: Payer: Self-pay | Admitting: Physician Assistant

## 2024-03-12 ENCOUNTER — Ambulatory Visit (HOSPITAL_COMMUNITY)
Admission: RE | Admit: 2024-03-12 | Discharge: 2024-03-12 | Disposition: A | Discharge status: Home / Self Care | Source: Ambulatory Visit | Attending: Physician Assistant | Admitting: Physician Assistant

## 2024-03-12 DIAGNOSIS — K219 Gastro-esophageal reflux disease without esophagitis: Secondary | ICD-10-CM

## 2024-03-12 DIAGNOSIS — K582 Mixed irritable bowel syndrome: Secondary | ICD-10-CM

## 2024-03-12 DIAGNOSIS — F3132 Bipolar disorder, current episode depressed, moderate: Secondary | ICD-10-CM

## 2024-03-12 DIAGNOSIS — R1013 Epigastric pain: Secondary | ICD-10-CM

## 2024-03-12 DIAGNOSIS — R112 Nausea with vomiting, unspecified: Secondary | ICD-10-CM

## 2024-03-12 DIAGNOSIS — K449 Diaphragmatic hernia without obstruction or gangrene: Secondary | ICD-10-CM | POA: Insufficient documentation

## 2024-03-12 DIAGNOSIS — K52832 Lymphocytic colitis: Secondary | ICD-10-CM

## 2024-03-16 ENCOUNTER — Other Ambulatory Visit: Payer: Self-pay | Admitting: Physician Assistant

## 2024-03-24 ENCOUNTER — Ambulatory Visit: Admitting: Internal Medicine

## 2024-03-24 VITALS — BP 136/70 | HR 79 | Temp 97.6°F | Resp 16 | Ht 62.0 in | Wt 147.4 lb

## 2024-03-24 DIAGNOSIS — F410 Panic disorder [episodic paroxysmal anxiety] without agoraphobia: Secondary | ICD-10-CM

## 2024-03-24 DIAGNOSIS — F411 Generalized anxiety disorder: Secondary | ICD-10-CM | POA: Diagnosis not present

## 2024-03-24 DIAGNOSIS — F3132 Bipolar disorder, current episode depressed, moderate: Secondary | ICD-10-CM | POA: Diagnosis not present

## 2024-03-24 MED ORDER — SERTRALINE HCL 100 MG PO TABS: 200.0000 mg | ORAL_TABLET | Freq: Every day | ORAL | 1 refills | Status: AC

## 2024-03-24 MED ORDER — CLONAZEPAM 0.5 MG PO TABS: 0.5000 mg | ORAL_TABLET | Freq: Two times a day (BID) | ORAL | 1 refills | Status: DC | PRN

## 2024-03-24 NOTE — Progress Notes (Signed)
 Office Visit  Subjective   Patient ID: Brandy Torres   DOB: [birth year]   Age: 78 y.o.   MRN: [id number]   Chief Complaint Chief Complaint  Patient presents with   Anxiety    Pt in today for a week anxiety follow up. The patient reported she is taking her medication as prescribed with some relief, states the Xanax  helps when she takes it but wears off fast.      History of Present Illness The patient is a 78 year old female who returns for a 2 week followup of her depression and anxiety.   I saw her a month ago where she was having side effects from depakote  with confusion and increased anxiousness.  I cut her depakote  ER back to 250mg  at night (she was on depakote  ER 500mg  BID) and we also increased her sertraline  from 100mg  to 150mg  daily and continued her on buspar  15mg  TID and xanax  as needed.  She took Depakote  ER 250mg  daily for about a week but she still had the same symptoms so she therefore stopped it.  She has some dizziness which she did not feel was due to the buspar  which she has been on for almost a year.  She got dizziness after developing diarrhea.  On her last visit a month ago, we stopped her depakote  and continued her on sertraline  150mg  daily as today she should be feeling full effect from this medication.  Today, she states that her depression is a little improved.   She states that she was having worsening depression and anxiety on her last visit and she was having panic attacks every day.  She continues to have panic attacks this often.  She gets agitated and upset about things about her family.  She had manic depression post partum in 1973.  The patient has tried lexapro where this did not help.  She has tried citalopram, wellbutrin and prozac in the past.  The patient was on lithium in the 1970 up until 1980's.  She developed tardive dyskinesia and she stopped it.  She denies any mania at this time and is mostly depression with anxiety as described.  The patient is currently  on zoloft  150mg  daily, buspar  15mg  TID, and xanax  0.25mg  BID prn which she is only taking once per day.  She has been on abilifty and vraylar in the past but this made her aggressive.  She has been on seroquel in the past but she gained weight.  She reports extreme feelings of guilt, feelings of isolation, feelings of worthlessness, helpless feeling, fatigue, social withdrawal, and loss of interest in pleasurable activities.  She denies any problems with difficulty performing routine daily activities, insomnia, concentrating, fatigue, suicidal ideation, homicidal ideation, weight loss, loss of appetite, and no out of control feelings. This patient feels that she is able to care for herself. She currently lives with husband and her 54 yo grandson.  She has been admitted to a psychiatric facility twice in the past in 1972.  She received 8 electroshock in the hospital.  The second hospitalization was in 1977.  She is not seeing a psychiatrist or therapist now.  She does not want to see either.     Past Medical History Past Medical History:  Diagnosis Date   Acid reflux    Allergic rhinoconjunctivitis    Allergy    Anemia    Anxiety    Asthma    AV nodal re-entry tachycardia [facility])    Cardiac  Ablation   Cataract    Chronic headaches    Colon polyps    Depression    Duodenal ulcer    Fibromyalgia    GERD (gastroesophageal reflux disease)    High blood pressure    High cholesterol    Hypothyroidism    IBS (irritable bowel syndrome)    Lymphoma (HCC)    multiple and benign per patient    Osteoarthritis    Osteoporosis    Radiculopathy, lumbar region 11/04/2023   UTI (urinary tract infection)      Allergies Allergies  Allergen Reactions   Olanzapine  Swelling   Avelox [Moxifloxacin Hcl In Nacl] Swelling    Throat swelling.   Benzoxonium Chloride [Benzoxonium]     Eyes red, pain, itching   Celebrex [Celecoxib] Other (See Comments)    Throat, mouth thickness   Depakote  [Divalproex   Sodium]     Trouble speaking, confusion   Dexilant  [Dexlansoprazole ] Other (See Comments)    Colitis   Doxycycline  Other (See Comments)    Mouth tingling Lip burning    Penicillins Other (See Comments)    Itching at injection site   Prevacid [Lansoprazole] Other (See Comments)    Colitis    Singulair  [Montelukast  Sodium] Other (See Comments)    Hair shedding     Medications  Current Outpatient Medications:    Acetaminophen (TYLENOL PO), Take 650 mg by mouth as needed (pain)., Disp: , Rfl:    ALPRAZolam  (XANAX  XR) 0.5 MG 24 hr tablet, Take 1 tablet (0.5 mg total) by mouth daily., Disp: 30 tablet, Rfl: 1   amLODipine  (NORVASC ) 10 MG tablet, Take 1 tablet (10 mg total) by mouth daily., Disp: 90 tablet, Rfl: 1   azelastine  (ASTELIN ) 0.1 % nasal spray, Place 2 sprays into both nostrils 2 (two) times daily. Use in each nostril as directed, Disp: 30 mL, Rfl: 5   budesonide  (ENTOCORT EC ) 3 MG 24 hr capsule, Take 3 capsules (9 mg total) by mouth daily for 60 days, THEN 2 capsules (6 mg total) daily for 14 days, THEN 1 capsule (3 mg total) daily for 14 days., Disp: 222 capsule, Rfl: 0   busPIRone  (BUSPAR ) 15 MG tablet, Take 1 tablet (15 mg total) by mouth 3 (three) times daily., Disp: 90 tablet, Rfl: 5   Calcium  Carb-Cholecalciferol 600-800 MG-UNIT TABS, Take 1 tablet by mouth 2 (two) times daily., Disp: , Rfl:    esomeprazole  (NEXIUM ) 40 MG capsule, Take 1 capsule (40 mg total) by mouth 2 (two) times daily before a meal., Disp: 60 capsule, Rfl: 3   famotidine (PEPCID) 20 MG tablet, Take 20 mg by mouth at bedtime., Disp: , Rfl:    FIBER PO, Take 1 tablet by mouth as needed (GI homeostatsis)., Disp: , Rfl:    Glucosamine HCl 1500 MG TABS, Take 1 tablet by mouth daily., Disp: , Rfl:    levothyroxine  (SYNTHROID ) 125 MCG tablet, Take 1 tablet (125 mcg total) by mouth daily., Disp: 30 tablet, Rfl: 2   Methylsulfonylmethane (MSM) 1500 MG TABS, Take 1 tablet by mouth 2 (two) times daily., Disp: , Rfl:     Multiple Vitamins-Minerals (MULTIVITAMIN ADULT PO), Take 1 tablet by mouth daily., Disp: , Rfl:    potassium chloride  SA (KLOR-CON  M) 20 MEQ tablet, Take 1 tablet (20 mEq total) by mouth 2 (two) times daily., Disp: 180 tablet, Rfl: 0   rosuvastatin  (CRESTOR ) 40 MG tablet, Take 1 tablet (40 mg total) by mouth daily., Disp: 90 tablet, Rfl: 1   sertraline  (  ZOLOFT ) 100 MG tablet, Take 1.5 tablets (150 mg total) by mouth daily., Disp: 135 tablet, Rfl: 1   Review of Systems Review of Systems  Constitutional:  Negative for chills and fever.  Eyes:  Negative for blurred vision.  Respiratory:  Negative for shortness of breath.   Cardiovascular:  Negative for chest pain and palpitations.  Gastrointestinal:  Negative for abdominal pain, constipation, diarrhea, nausea and vomiting.  Neurological:  Negative for dizziness, focal weakness and headaches.  Psychiatric/Behavioral:  Positive for depression. Negative for substance abuse and suicidal ideas. The patient has insomnia.        Objective:    Vitals BP 136/70   Pulse 79   Temp 97.6 F (36.4 C) (Temporal)   Resp 16   Ht 5' 2 (1.575 m)   Wt 147 lb 6.4 oz (66.9 kg)   SpO2 97%   BMI 26.96 kg/m    Physical Examination Physical Exam Constitutional:      Appearance: Normal appearance. She is not ill-appearing.  Cardiovascular:     Rate and Rhythm: Normal rate and regular rhythm.     Pulses: Normal pulses.     Heart sounds: No murmur heard.    No friction rub. No gallop.  Pulmonary:     Effort: Pulmonary effort is normal. No respiratory distress.     Breath sounds: No wheezing, rhonchi or rales.  Abdominal:     General: Bowel sounds are normal. There is no distension.     Palpations: Abdomen is soft.     Tenderness: There is no abdominal tenderness.  Musculoskeletal:     Right lower leg: No edema.     Left lower leg: No edema.  Skin:    General: Skin is warm and dry.     Findings: No rash.  Neurological:     Mental Status:  She is alert.        Assessment & Plan:   Bipolar affective disorder, currently depressed, moderate (HCC) She states her anxiety is worse than her depression.  I am going to increase her sertraline  from 150mg  to 200mg  daily.  I am going to discontinue her xanax  and start her on clonazepam  0.5mg  po BID and she is only to take this as needed.  She will continue on buspar  15mg  TID.  She denies any dizziness.  Panic disorder Plan as above.  GAD (generalized anxiety disorder) Plan as above.    Return in about 8 weeks (around 05/19/2024).   Selinda Fleeta Finger, MD

## 2024-03-24 NOTE — Assessment & Plan Note (Signed)
 She states her anxiety is worse than her depression.  I am going to increase her sertraline  from 150mg  to 200mg  daily.  I am going to discontinue her xanax  and start her on clonazepam  0.5mg  po BID and she is only to take this as needed.  She will continue on buspar  15mg  TID.  She denies any dizziness.

## 2024-03-24 NOTE — Assessment & Plan Note (Signed)
 Plan as above.

## 2024-03-25 ENCOUNTER — Ambulatory Visit: Admitting: Internal Medicine

## 2024-03-27 ENCOUNTER — Other Ambulatory Visit: Payer: Self-pay

## 2024-03-27 NOTE — Progress Notes (Signed)
 Discontinued Med 03/24/24

## 2024-04-24 NOTE — Progress Notes (Unsigned)
 04/27/2024 Brandy Torres [id number] 1946-05-20  Referring provider: Fleeta Valeria Mayo, MD Primary GI doctor: Dr. Charlanne  ASSESSMENT AND PLAN:  History of lymphocytic colitis diagnosed 2020 treated with budesonide   and IBS mixed  stool studies negative with PCP last week for Cdiff, stool culture Has some nausea, chills, no fever, no AB pain but has griping 2022 negative CRP, celiac panel 10/09/2018 colonoscopy with moderate predominantly sigmoid diverticulosis and small internal hemorrhoids otherwise normal.   Pathology showed lymphocytic colitis.   Patient given budesonide  9 mg p.o. daily x8 weeks, then 6 mg for 2 weeks then 3 mg for another 2 weeks.  Told to avoid NSAIDs and use Imodium as needed, currently on budesonide  6 mg daily Improvement with Benefiber 1 TBSP a day and dicyclomine   still with nausea, increase gas, no AB pain, weight increasing  had increase in the sertaline from 150 to 200 mg a day  -continue budesonide  6 mg taper, consider repeat.  - cut back on nexium  to once day - increase fiber to twice a day - some concern of increase in SSRI, monitor, consider SNRI - add on zofran  up to twice a day for nausea and diarrhea  GERD has some AB pain, epigastric squeezing pain, negative cardiac work up, was on NSAIDs short term for tooth 2020 ultrasound abdomen multiple cysts throughout liver 10/09/2018 EGD with hiatal hernia multiple gastric polyps.   Pathology showed fundic gland polyps.  03/29/2024 UGI spontaneous low-volume reflux noted to upper third of esophagus, small HH no mucosal irregularity in the esophagus stomach or duodenum barium tablet passed normally This resolved with Nexium  twice daily, will try to cut back to once daily -alginate therapy given  Fibromyalgia  Patient Care Team: Fleeta Valeria Mayo, MD as PCP - General (Internal Medicine)  HISTORY OF PRESENT ILLNESS: 78 y.o. female with a past medical history listed below presents for evaluation of  diarrhea.   Last seen in the office 02/21/2024 by myself for diarrhea.  Suggest adding back on fiber, with previous history of lymphocytic colitis suggested budesonide  9 mg daily taper.  Labs showed no anemia, normal kidney liver, normal sed rate thyroid .  KUB showed nonobstructive bowel gas pattern and no substantial stool in the x-ray.  Discussed the use of AI scribe software for clinical note transcription with the patient, who gave verbal consent to proceed.  History of Present Illness   Brandy Torres is a 78 year old female with lymphocytic colitis who presents with ongoing diarrhea and gastrointestinal symptoms.  She has a history of lymphocytic colitis diagnosed via colonoscopy in 2020. Her diarrhea was initially explosive and frequent, occurring every 30 minutes, but has improved to softer, less frequent stools. The current stool consistency is described as 'like [employer] ice cream', occurring once daily, usually in the morning.  She was initially prescribed budesonide  and colestipol . Currently, she takes budesonide  2 pills a day and has stopped colestipol  after a 30-day course, as it did not improve her bowel movements and caused her stools to appear like 'chopped up grass'.  She experiences nausea, particularly in the mornings, but no vomiting. There is a history of nausea and vomiting in the past. No significant abdominal pain is reported, but there is gas-like discomfort.  Her dietary intake has increased recently, including foods she considers 'offenders' for her colitis, such as coffee, ice cream, and pizza. She drinks Pedialyte due to dehydration concerns.  She underwent dental surgery in June, after which she took  antibiotics and ibuprofen, potentially exacerbating her symptoms. A significant flare of symptoms followed this period.  She takes a fiber supplement, one tablespoon in her morning coffee, and [employer] capsules. She has also been on increased doses of Nexium  and  sertraline , and recently started clomazepine for panic attacks. She experienced dizziness and elevated blood pressure after taking clomazepine.  She reports weight loss but notes her weight is now increasing. She has experienced hair thinning, which she associates with her medication use.  She has a history of panic attacks and has been on sertraline  since February. Stress and anxiety are noted to impact her gastrointestinal symptoms.     She  reports that she has never smoked. She has never used smokeless tobacco. She reports that she does not drink alcohol and does not use drugs.  RELEVANT GI HISTORY, IMAGING AND LABS: Results   RADIOLOGY Upper GI series: Normal esophagus, stomach, and small bowel  DIAGNOSTIC Colonoscopy: Lymphocytic colitis (2020)      CBC    Component Value Date/Time   WBC 8.3 02/21/2024 1154   RBC 4.58 02/21/2024 1154   HGB 13.1 02/21/2024 1154   HGB 12.8 11/04/2023 1514   HCT 40.2 02/21/2024 1154   HCT 39.7 11/04/2023 1514   PLT 211.0 02/21/2024 1154   PLT 193 11/04/2023 1514   MCV 87.6 02/21/2024 1154   MCV 93 11/04/2023 1514   MCH 30.1 11/04/2023 1514   MCH 29.7 09/26/2018 1455   MCHC 32.7 02/21/2024 1154   RDW 14.9 02/21/2024 1154   RDW 13.2 11/04/2023 1514   LYMPHSABS 2.2 02/21/2024 1154   LYMPHSABS 3.0 11/04/2023 1514   MONOABS 1.0 02/21/2024 1154   EOSABS 0.0 02/21/2024 1154   EOSABS 0.2 11/04/2023 1514   BASOSABS 0.0 02/21/2024 1154   BASOSABS 0.1 11/04/2023 1514   Recent Labs    11/04/23 1514 02/21/24 1154  HGB 12.8 13.1    CMP     Component Value Date/Time   NA 141 02/21/2024 1154   NA 143 01/29/2024 1627   K 4.0 02/21/2024 1154   CL 105 02/21/2024 1154   CO2 28 02/21/2024 1154   GLUCOSE 91 02/21/2024 1154   BUN 12 02/21/2024 1154   BUN 13 01/29/2024 1627   CREATININE 0.68 02/21/2024 1154   CREATININE 0.72 09/26/2018 1455   CALCIUM  9.2 02/21/2024 1154   PROT 6.4 02/21/2024 1154   PROT 6.2 01/29/2024 1627   ALBUMIN 4.0  02/21/2024 1154   ALBUMIN 4.3 01/29/2024 1627   AST 18 02/21/2024 1154   ALT 10 02/21/2024 1154   ALKPHOS 39 02/21/2024 1154   BILITOT 0.3 02/21/2024 1154   BILITOT 0.3 01/29/2024 1627      Latest Ref Rng & Units 02/21/2024   11:54 AM 01/29/2024    4:27 PM 11/25/2023   10:05 AM  Hepatic Function  Total Protein 6.0 - 8.3 g/dL 6.4  6.2    Albumin 3.5 - 5.2 g/dL 4.0  4.3  4.5   AST 0 - 37 U/L 18  21    ALT 0 - 35 U/L 10  15    Alk Phosphatase 39 - 117 U/L 39  60    Total Bilirubin 0.2 - 1.2 mg/dL 0.3  0.3        Current Medications:   Current Outpatient Medications (Endocrine & Metabolic):    budesonide  (ENTOCORT EC ) 3 MG 24 hr capsule, Take 3 capsules (9 mg total) by mouth daily for 60 days, THEN 2 capsules (6 mg total)  daily for 14 days, THEN 1 capsule (3 mg total) daily for 14 days.   levothyroxine  (SYNTHROID ) 125 MCG tablet, Take 1 tablet (125 mcg total) by mouth daily.  Current Outpatient Medications (Cardiovascular):    amLODipine  (NORVASC ) 10 MG tablet, Take 1 tablet (10 mg total) by mouth daily.   rosuvastatin  (CRESTOR ) 40 MG tablet, Take 1 tablet (40 mg total) by mouth daily.  Current Outpatient Medications (Respiratory):    azelastine  (ASTELIN ) 0.1 % nasal spray, Place 2 sprays into both nostrils 2 (two) times daily. Use in each nostril as directed  Current Outpatient Medications (Analgesics):    Acetaminophen (TYLENOL PO), Take 650 mg by mouth as needed (pain).   Current Outpatient Medications (Other):    busPIRone  (BUSPAR ) 15 MG tablet, Take 1 tablet (15 mg total) by mouth 3 (three) times daily.   Calcium  Carb-Cholecalciferol 600-800 MG-UNIT TABS, Take 1 tablet by mouth 2 (two) times daily.   clonazePAM  (KLONOPIN ) 0.5 MG tablet, Take 1 tablet (0.5 mg total) by mouth 2 (two) times daily as needed for anxiety.   esomeprazole  (NEXIUM ) 40 MG capsule, Take 1 capsule (40 mg total) by mouth 2 (two) times daily before a meal.   famotidine (PEPCID) 20 MG tablet, Take 20 mg  by mouth at bedtime.   FIBER PO, Take 1 tablet by mouth as needed (GI homeostatsis).   Glucosamine HCl 1500 MG TABS, Take 1 tablet by mouth daily.   Methylsulfonylmethane (MSM) 1500 MG TABS, Take 1 tablet by mouth 2 (two) times daily.   Multiple Vitamins-Minerals (MULTIVITAMIN ADULT PO), Take 1 tablet by mouth daily.   ondansetron  (ZOFRAN ) 4 MG tablet, Take 1 tablet (4 mg total) by mouth 2 (two) times daily as needed for nausea (diarrhea).   potassium chloride  SA (KLOR-CON  M) 20 MEQ tablet, Take 1 tablet (20 mEq total) by mouth 2 (two) times daily.   sertraline  (ZOLOFT ) 100 MG tablet, Take 2 tablets (200 mg total) by mouth daily.  Medical History:  Past Medical History:  Diagnosis Date   Acid reflux    Allergic rhinoconjunctivitis    Allergy    Anemia    Anxiety    Asthma    AV nodal re-entry tachycardia [facility])    Cardiac Ablation   Cataract    Chronic headaches    Colon polyps    Depression    Duodenal ulcer    Fibromyalgia    GERD (gastroesophageal reflux disease)    High blood pressure    High cholesterol    Hypothyroidism    IBS (irritable bowel syndrome)    Lymphoma (HCC)    multiple and benign per patient    Osteoarthritis    Osteoporosis    Radiculopathy, lumbar region 11/04/2023   UTI (urinary tract infection)    Allergies:  Allergies  Allergen Reactions   Olanzapine  Swelling   Avelox [Moxifloxacin Hcl In Nacl] Swelling    Throat swelling.   Benzoxonium Chloride [Benzoxonium]     Eyes red, pain, itching   Celebrex [Celecoxib] Other (See Comments)    Throat, mouth thickness   Depakote  [Divalproex  Sodium]     Trouble speaking, confusion   Dexilant  [Dexlansoprazole ] Other (See Comments)    Colitis   Doxycycline  Other (See Comments)    Mouth tingling Lip burning    Penicillins Other (See Comments)    Itching at injection site   Prevacid [Lansoprazole] Other (See Comments)    Colitis    Singulair  [Montelukast  Sodium] Other (See Comments)    Hair shedding  Surgical History:  She  has a past surgical history that includes Tubal ligation (1982); Thyroidectomy (1984); Bladder surgery (2002); Knee arthroscopy (Right, 2011); Cataract extraction (2016); Tonsillectomy; Adenoidectomy; Ablation (2000); Esophagogastroduodenoscopy (11/10/2012); Colonoscopy (12/14/2009); and Upper gastrointestinal endoscopy. Family History:  Her family history includes Allergic rhinitis in her son; Breast cancer in her maternal aunt and maternal grandmother; COPD in her father; Colonic polyp in her mother; Coronary artery disease in her father; Diabetes in her sister; Dystonia in her son; Paget's disease of bone in her maternal aunt; Pancreatic cancer in her paternal uncle and paternal uncle; Rheum arthritis in her sister and son; Stomach cancer in her paternal aunt.  REVIEW OF SYSTEMS  : All other systems reviewed and negative except where noted in the History of Present Illness.  PHYSICAL EXAM: BP (!) 140/70 (BP Location: Left Arm, Patient Position: Sitting, Cuff Size: Normal)   Pulse 78   Ht 4' 11.84 (1.52 m) Comment: height without shoes  Wt 151 lb 1 oz (68.5 kg)   BMI 29.66 kg/m  Physical Exam   VITALS: BP- 186/90 MEASUREMENTS: Weight- 151. GENERAL APPEARANCE: Well nourished, in no apparent distress HEENT: No cervical lymphadenopathy, unremarkable thyroid , sclerae anicteric, conjunctiva pink RESPIRATORY: Respiratory effort normal, BS equal bilateral without rales, rhonchi, wheezing CARDIO: RRR with no MRGs, peripheral pulses intact ABDOMEN: Soft, non distended, active bowel sounds in all 4 quadrants, no tenderness to palpation, no rebound, no mass appreciated RECTAL: declines MUSCULOSKELETAL: Full ROM, normal gait, without edema SKIN: Dry, intact without rashes or lesions. No jaundice. NEURO: Alert, oriented, no focal deficits PSYCH: Cooperative, normal mood and affect.      Alan JONELLE Coombs, PA-C 11:50 AM

## 2024-04-26 ENCOUNTER — Other Ambulatory Visit: Payer: Self-pay | Admitting: Internal Medicine

## 2024-04-27 ENCOUNTER — Encounter: Payer: Self-pay | Admitting: Physician Assistant

## 2024-04-27 ENCOUNTER — Other Ambulatory Visit (INDEPENDENT_AMBULATORY_CARE_PROVIDER_SITE_OTHER)

## 2024-04-27 ENCOUNTER — Ambulatory Visit: Admitting: Physician Assistant

## 2024-04-27 ENCOUNTER — Ambulatory Visit: Payer: Self-pay | Admitting: Physician Assistant

## 2024-04-27 VITALS — BP 140/70 | HR 78 | Ht 59.84 in | Wt 151.1 lb

## 2024-04-27 DIAGNOSIS — Z8719 Personal history of other diseases of the digestive system: Secondary | ICD-10-CM | POA: Diagnosis not present

## 2024-04-27 DIAGNOSIS — R1013 Epigastric pain: Secondary | ICD-10-CM | POA: Diagnosis not present

## 2024-04-27 DIAGNOSIS — R112 Nausea with vomiting, unspecified: Secondary | ICD-10-CM

## 2024-04-27 DIAGNOSIS — K582 Mixed irritable bowel syndrome: Secondary | ICD-10-CM | POA: Diagnosis not present

## 2024-04-27 DIAGNOSIS — R110 Nausea: Secondary | ICD-10-CM

## 2024-04-27 DIAGNOSIS — K52832 Lymphocytic colitis: Secondary | ICD-10-CM

## 2024-04-27 DIAGNOSIS — K449 Diaphragmatic hernia without obstruction or gangrene: Secondary | ICD-10-CM

## 2024-04-27 DIAGNOSIS — K219 Gastro-esophageal reflux disease without esophagitis: Secondary | ICD-10-CM | POA: Diagnosis not present

## 2024-04-27 LAB — COMPREHENSIVE METABOLIC PANEL WITH GFR
ALT: 18 U/L (ref 0–35)
AST: 19 U/L (ref 0–37)
Albumin: 4.7 g/dL (ref 3.5–5.2)
Alkaline Phosphatase: 44 U/L (ref 39–117)
BUN: 20 mg/dL (ref 6–23)
CO2: 29 meq/L (ref 19–32)
Calcium: 9.9 mg/dL (ref 8.4–10.5)
Chloride: 102 meq/L (ref 96–112)
Creatinine, Ser: 0.71 mg/dL (ref 0.40–1.20)
GFR: 80.77 mL/min (ref >60.00)
Glucose, Bld: 97 mg/dL (ref 70–99)
Potassium: 3.9 meq/L (ref 3.5–5.1)
Sodium: 141 meq/L (ref 135–145)
Total Bilirubin: 0.5 mg/dL (ref 0.2–1.2)
Total Protein: 6.9 g/dL (ref 6.0–8.3)

## 2024-04-27 LAB — CBC WITH DIFFERENTIAL/PLATELET
Basophils Absolute: 0.1 K/uL (ref 0.0–0.1)
Basophils Relative: 0.7 % (ref 0.0–3.0)
Eosinophils Absolute: 0.1 K/uL (ref 0.0–0.7)
Eosinophils Relative: 1.2 % (ref 0.0–5.0)
HCT: 40.2 % (ref 36.0–46.0)
Hemoglobin: 13.3 g/dL (ref 12.0–15.0)
Lymphocytes Relative: 25.2 % (ref 12.0–46.0)
Lymphs Abs: 2.2 K/uL (ref 0.7–4.0)
MCHC: 33.2 g/dL (ref 30.0–36.0)
MCV: 89.7 fl (ref 78.0–100.0)
Monocytes Absolute: 0.6 K/uL (ref 0.1–1.0)
Monocytes Relative: 6.3 % (ref 3.0–12.0)
Neutro Abs: 5.9 K/uL (ref 1.4–7.7)
Neutrophils Relative %: 66.6 % (ref 43.0–77.0)
Platelets: 208 K/uL (ref 150.0–400.0)
RBC: 4.48 Mil/uL (ref 3.87–5.11)
RDW: 16.8 % — ABNORMAL HIGH (ref 11.5–15.5)
WBC: 8.8 K/uL (ref 4.0–10.5)

## 2024-04-27 LAB — TSH: TSH: 4.23 u[IU]/mL (ref 0.35–5.50)

## 2024-04-27 LAB — SEDIMENTATION RATE: Sed Rate: 9 mm/h (ref 0–30)

## 2024-04-27 MED ORDER — ONDANSETRON HCL 4 MG PO TABS: 4.0000 mg | ORAL_TABLET | Freq: Two times a day (BID) | ORAL | 2 refills | Status: AC | PRN

## 2024-04-27 NOTE — Patient Instructions (Addendum)
 Your provider has requested that you go to the basement level for lab work before leaving today. Press B on the elevator. The lab is located at the first door on the left as you exit the elevator.  - increase the the benefiber to twice a day - cut back on the nexium  to once a day in the morning, and continue pepcid at night - can take zofran /ondansetron  in the morning to help with nausea AND the diarrhea  Continue to talk with Dr. Fleeta Finger  _______________________________________________________  If your blood pressure at your visit was 140/90 or greater, please contact your primary care physician to follow up on this.  _______________________________________________________  If you are age 79 or older, your body mass index should be between 23-30. Your Body mass index is 29.66 kg/m. If this is out of the aforementioned range listed, please consider follow up with your Primary Care Provider.  If you are age 46 or younger, your body mass index should be between 19-25. Your Body mass index is 29.66 kg/m. If this is out of the aformentioned range listed, please consider follow up with your Primary Care Provider.   ________________________________________________________  The [facility] providers would like to encourage you to use MYCHART to communicate with providers for non-urgent requests or questions.  Due to long hold times on the telephone, sending your provider a message by [facility] may be a faster and more efficient way to get a response.  Please allow 48 business hours for a response.  Please remember that this is for non-urgent requests.  _______________________________________________________  [facility] is using a team-based approach to care.  Your team is made up of your doctor and two to three APPS. Our APPS (Nurse Practitioners and Physician Assistants) work with your physician to ensure care continuity for you. They are fully qualified to address your health  concerns and develop a treatment plan. They communicate directly with your gastroenterologist to care for you. Seeing the Advanced Practice Practitioners on your physician's team can help you by facilitating care more promptly, often allowing for earlier appointments, access to diagnostic testing, procedures, and other specialty referrals.

## 2024-05-08 ENCOUNTER — Encounter: Admitting: Internal Medicine

## 2024-05-19 ENCOUNTER — Other Ambulatory Visit: Payer: Self-pay | Admitting: Internal Medicine

## 2024-06-04 ENCOUNTER — Other Ambulatory Visit: Payer: Self-pay

## 2024-06-04 MED ORDER — ROSUVASTATIN CALCIUM 40 MG PO TABS: 40.0000 mg | ORAL_TABLET | Freq: Every day | ORAL | 1 refills | Status: AC

## 2024-06-08 ENCOUNTER — Other Ambulatory Visit: Payer: Self-pay | Admitting: Internal Medicine

## 2024-06-08 MED ORDER — AMLODIPINE BESYLATE 10 MG PO TABS: 10.0000 mg | ORAL_TABLET | Freq: Every day | ORAL | 1 refills | Status: DC

## 2024-06-30 ENCOUNTER — Other Ambulatory Visit: Payer: Self-pay | Admitting: Internal Medicine

## 2024-07-03 ENCOUNTER — Ambulatory Visit: Admitting: Gastroenterology

## 2024-07-03 ENCOUNTER — Encounter: Payer: Self-pay | Admitting: Gastroenterology

## 2024-07-03 VITALS — BP 140/90 | HR 78 | Wt 163.2 lb

## 2024-07-03 DIAGNOSIS — K52832 Lymphocytic colitis: Secondary | ICD-10-CM

## 2024-07-03 MED ORDER — DIPHENOXYLATE-ATROPINE 2.5-0.025 MG PO TABS: 1.0000 | ORAL_TABLET | Freq: Four times a day (QID) | ORAL | 6 refills | Status: AC | PRN

## 2024-07-03 NOTE — Patient Instructions (Signed)
 We have sent the following medications to your pharmacy for you to pick up at your convenience:  Lomotil   _______________________________________________________  If your blood pressure at your visit was 140/90 or greater, please contact your primary care physician to follow up on this.  _______________________________________________________  If you are age 79 or older, your body mass index should be between 23-30. Your Body mass index is 32.05 kg/m. If this is out of the aforementioned range listed, please consider follow up with your Primary Care Provider.  If you are age 77 or younger, your body mass index should be between 19-25. Your Body mass index is 32.05 kg/m. If this is out of the aformentioned range listed, please consider follow up with your Primary Care Provider.   ________________________________________________________  The [facility] providers would like to encourage you to use MYCHART to communicate with providers for non-urgent requests or questions.  Due to long hold times on the telephone, sending your provider a message by [facility] may be a faster and more efficient way to get a response.  Please allow 48 business hours for a response.  Please remember that this is for non-urgent requests.  _______________________________________________________  [facility] is using a team-based approach to care.  Your team is made up of your doctor and two to three APPS. Our APPS (Nurse Practitioners and Physician Assistants) work with your physician to ensure care continuity for you. They are fully qualified to address your health concerns and develop a treatment plan. They communicate directly with your gastroenterologist to care for you. Seeing the Advanced Practice Practitioners on your physician's team can help you by facilitating care more promptly, often allowing for earlier appointments, access to diagnostic testing, procedures, and other specialty referrals.

## 2024-07-03 NOTE — Progress Notes (Signed)
 Chief Complaint: FU  Referring Provider:  Fleeta Finger, Selinda, MD      ASSESSMENT AND PLAN;   #1. H/O lymphocytic colitis Dx 2020, Rxed with budesonide  with assoc IBS. Pt not willing to stop zoloft /PPis  #2. GERD/HH with nausea  Plan: -Continue nexium  for now -Continue Zofran  4mg  PRN.8 -Trial Lomotil  1 tab po every day #30, 2RF -Minimize NSAIDs -If any Abdo pain or problems, CT Abdo/pelvis with contrast.  She would like to hold off currently as she is better.  HPI:    History of Present Illness Brandy Torres is a 78 year old female with lymphocytic colitis who presents with persistent diarrhea and nausea.   She experiences persistent diarrhea and nausea, primarily in the mornings, with three bowel movements each morning. The first is her 'new normal', while the subsequent ones are explosive and loose.  Thereafter she does not have any bowel movements for the rest of the day.  This is her new normal.  She has actually been gaining weight as below.  She has a history of lymphocytic colitis, which she believes was exacerbated by extensive antibiotic use for dental issues. She has been on various medications, including budesonide , which initially helped reduce the explosive diarrhea after about three weeks of use. However, she is currently off budesonide .  Her current medication regimen includes Imodium, taken daily to manage diarrhea, and omeprazole for nausea, which occurs four to five times a week. She also uses Zofran  as needed for nausea, which has been effective.  She has a history of IBS and acid reflux, for which she takes Nexium  due to a hiatal hernia. She has experienced significant weight fluctuations, having lost weight initially but regained it over the past three months, partly due to stress-related eating as her husband has been ill.  Her past medical history includes a nervous breakdown 50 years ago, and she has been on antidepressants since then. Recently, her Zoloft   dosage was increased, and she experienced full body edema as an allergic reaction to a different medication earlier this year.  She has undergone multiple colonoscopies and endoscopies, with the last colonoscopy performed in 2020. Her blood and stool tests were reported as normal two months ago.    Past GI workup: EGD 10/09/2018 -Hiatal hernia. - Multiple gastric polyps. ( Resected and retrieved x 2, to confirm hyperplastic) . -Neg SB Bx for celiac, negative gastric biopsies for H. pylori.  Fundic gland polyps  Colon 09/2018 - Moderate predominantly sigmoid diverticulosis. - Small internal hemorrhoids. - Otherwise normal colonoscopy to TI. - Bx: lymphocytic colitis  Wt Readings from Last 3 Encounters:  07/03/24 163 lb 4 oz (74 kg)  04/27/24 151 lb 1 oz (68.5 kg)  03/24/24 147 lb 6.4 oz (66.9 kg)     Past Medical History:  Diagnosis Date   Acid reflux    Allergic rhinoconjunctivitis    Allergy    Anemia    Anxiety    Asthma    AV nodal re-entry tachycardia    Cardiac Ablation   Cataract    Chronic headaches    Colon polyps    Depression    Duodenal ulcer    Fibromyalgia    GERD (gastroesophageal reflux disease)    High blood pressure    High cholesterol    Hypothyroidism    IBS (irritable bowel syndrome)    Lymphoma (HCC)    multiple and benign per patient    Osteoarthritis    Osteoporosis  Radiculopathy, lumbar region 11/04/2023   UTI (urinary tract infection)     Past Surgical History:  Procedure Laterality Date   ABLATION  2000   Cardiac ablation   ADENOIDECTOMY     BLADDER SURGERY  2002   Bladder Tack and Sling   CATARACT EXTRACTION  2016   COLONOSCOPY  12/14/2009   Mild sigmoid diverticulosis. Small internal hemorrhoids. Otherwise normal colonoscopy to TI.    ESOPHAGOGASTRODUODENOSCOPY  11/10/2012   Small hiatal hernia. Gastric polyps (status post polypectomy x2) with slightly increased size of the gastric polyps as compared to the previous EGD.     KNEE ARTHROSCOPY Right 2011   THYROIDECTOMY  1984   TONSILLECTOMY     age 57   TUBAL LIGATION  1982   UPPER GASTROINTESTINAL ENDOSCOPY      Family History  Problem Relation Age of Onset   COPD Father    Coronary artery disease Father    Rheum arthritis Sister    Breast cancer Maternal Aunt    Breast cancer Maternal Grandmother    Paget's disease of bone Maternal Aunt    Allergic rhinitis Son    Dystonia Son    Rheum arthritis Son    Diabetes Sister    Colonic polyp Mother    Stomach cancer Paternal Aunt    Pancreatic cancer Paternal Uncle    Pancreatic cancer Paternal Uncle    Colon cancer Neg Hx    Esophageal cancer Neg Hx    Rectal cancer Neg Hx    Thyroid  disease Neg Hx     Social History   Tobacco Use   Smoking status: Never   Smokeless tobacco: Never  Vaping Use   Vaping status: Never Used  Substance Use Topics   Alcohol use: Never   Drug use: Never    Current Outpatient Medications  Medication Sig Dispense Refill   Acetaminophen (TYLENOL PO) Take 650 mg by mouth as needed (pain).     amLODipine  (NORVASC ) 10 MG tablet Take 1 tablet (10 mg total) by mouth daily. 90 tablet 1   azelastine  (ASTELIN ) 0.1 % nasal spray Place 2 sprays into both nostrils 2 (two) times daily. Use in each nostril as directed 30 mL 5   busPIRone  (BUSPAR ) 15 MG tablet Take 1 tablet (15 mg total) by mouth 3 (three) times daily. 90 tablet 5   Calcium  Carb-Cholecalciferol 600-800 MG-UNIT TABS Take 1 tablet by mouth 2 (two) times daily.     clonazePAM  (KLONOPIN ) 0.5 MG tablet Take 1 tablet (0.5 mg total) by mouth 2 (two) times daily as needed for anxiety. 60 tablet 1   esomeprazole  (NEXIUM ) 40 MG capsule Take 1 capsule (40 mg total) by mouth 2 (two) times daily before a meal. (Patient taking differently: Take 40 mg by mouth daily.) 60 capsule 3   famotidine (PEPCID) 20 MG tablet Take 20 mg by mouth at bedtime.     FIBER PO Take 1 tablet by mouth as needed (GI homeostatsis).     Glucosamine  HCl 1500 MG TABS Take 1 tablet by mouth daily.     levothyroxine  (SYNTHROID ) 125 MCG tablet Take 1 tablet by mouth once daily 30 tablet 0   Methylsulfonylmethane (MSM) 1500 MG TABS Take 1 tablet by mouth 2 (two) times daily.     Multiple Vitamins-Minerals (MULTIVITAMIN ADULT PO) Take 1 tablet by mouth daily.     ondansetron  (ZOFRAN ) 4 MG tablet Take 1 tablet (4 mg total) by mouth 2 (two) times daily as needed for nausea (  diarrhea). 60 tablet 2   potassium chloride  SA (KLOR-CON  M) 20 MEQ tablet Take 1 tablet by mouth twice daily 180 tablet 0   rosuvastatin  (CRESTOR ) 40 MG tablet Take 1 tablet (40 mg total) by mouth daily. 90 tablet 1   sertraline  (ZOLOFT ) 100 MG tablet Take 2 tablets (200 mg total) by mouth daily. 180 tablet 1   No current facility-administered medications for this visit.    Allergies  Allergen Reactions   Olanzapine  Swelling   Avelox [Moxifloxacin Hcl In Nacl] Swelling    Throat swelling.   Benzoxonium Chloride [Benzoxonium]     Eyes red, pain, itching   Celebrex [Celecoxib] Other (See Comments)    Throat, mouth thickness   Depakote  [Divalproex  Sodium]     Trouble speaking, confusion   Dexilant  [Dexlansoprazole ] Other (See Comments)    Colitis   Doxycycline  Other (See Comments)    Mouth tingling Lip burning    Penicillins Other (See Comments)    Itching at injection site   Prevacid [Lansoprazole] Other (See Comments)    Colitis    Singulair  [Montelukast  Sodium] Other (See Comments)    Hair shedding    Review of Systems:  neg     Physical Exam:    BP (!) 140/90   Pulse 78   Wt 163 lb 4 oz (74 kg)   BMI 32.05 kg/m  Wt Readings from Last 3 Encounters:  07/03/24 163 lb 4 oz (74 kg)  04/27/24 151 lb 1 oz (68.5 kg)  03/24/24 147 lb 6.4 oz (66.9 kg)   Constitutional:  Well-developed, in no acute distress. Psychiatric: Normal mood and affect. Behavior is normal. HEENT: Pupils normal.  Conjunctivae are normal. No scleral icterus. Cardiovascular: Normal  rate, regular rhythm. No edema Pulmonary/chest: Effort normal and breath sounds normal. No wheezing, rales or rhonchi. Abdominal: Soft, nondistended. Nontender. Bowel sounds active throughout. There are no masses palpable. No hepatomegaly. Rectal: Deferred Neurological: Alert and oriented to person place and time. Skin: Skin is warm and dry. No rashes noted.  Data Reviewed: I have personally reviewed following labs and imaging studies  CBC:    Latest Ref Rng & Units 04/27/2024   11:12 AM 02/21/2024   11:54 AM 11/04/2023    3:14 PM  CBC  WBC 4.0 - 10.5 K/uL 8.8  8.3  8.3   Hemoglobin 12.0 - 15.0 g/dL 86.6  86.8  87.1   Hematocrit 36.0 - 46.0 % 40.2  40.2  39.7   Platelets 150.0 - 400.0 K/uL 208.0  211.0  193     CMP:    Latest Ref Rng & Units 04/27/2024   11:12 AM 02/21/2024   11:54 AM 01/29/2024    4:27 PM  CMP  Glucose 70 - 99 mg/dL 97  91  89   BUN 6 - 23 mg/dL 20  12  13    Creatinine 0.40 - 1.20 mg/dL 9.28  9.31  9.43   Sodium 135 - 145 mEq/L 141  141  143   Potassium 3.5 - 5.1 mEq/L 3.9  4.0  3.1   Chloride 96 - 112 mEq/L 102  105  102   CO2 19 - 32 mEq/L 29  28  22    Calcium  8.4 - 10.5 mg/dL 9.9  9.2  9.4   Total Protein 6.0 - 8.3 g/dL 6.9  6.4  6.2   Total Bilirubin 0.2 - 1.2 mg/dL 0.5  0.3  0.3   Alkaline Phos 39 - 117 U/L 44  39  60  AST 0 - 37 U/L 19  18  21    ALT 0 - 35 U/L 18  10  15          Anselm Bring, MD 07/03/2024, 4:14 PM  Cc: Fleeta Valeria Mayo, MD

## 2024-07-06 ENCOUNTER — Other Ambulatory Visit: Payer: Self-pay | Admitting: Internal Medicine

## 2024-07-06 MED ORDER — LEVOTHYROXINE SODIUM 125 MCG PO TABS: 125.0000 ug | ORAL_TABLET | Freq: Every day | ORAL | 0 refills | Status: DC

## 2024-07-10 ENCOUNTER — Ambulatory Visit: Admitting: Internal Medicine

## 2024-07-10 ENCOUNTER — Encounter: Payer: Self-pay | Admitting: Internal Medicine

## 2024-07-10 VITALS — BP 180/84 | HR 72 | Temp 97.7°F | Resp 16 | Ht 59.0 in | Wt 165.6 lb

## 2024-07-10 DIAGNOSIS — F332 Major depressive disorder, recurrent severe without psychotic features: Secondary | ICD-10-CM | POA: Diagnosis not present

## 2024-07-10 DIAGNOSIS — Z23 Encounter for immunization: Secondary | ICD-10-CM | POA: Diagnosis not present

## 2024-07-10 DIAGNOSIS — I10 Essential (primary) hypertension: Secondary | ICD-10-CM

## 2024-07-10 MED ORDER — LEVOTHYROXINE SODIUM 125 MCG PO TABS: 125.0000 ug | ORAL_TABLET | Freq: Every day | ORAL | 0 refills | Status: DC

## 2024-07-10 MED ORDER — CLONAZEPAM 0.5 MG PO TABS: 0.5000 mg | ORAL_TABLET | Freq: Two times a day (BID) | ORAL | 1 refills | Status: DC | PRN

## 2024-07-10 MED ORDER — SERTRALINE HCL 100 MG PO TABS: 150.0000 mg | ORAL_TABLET | Freq: Every day | ORAL | 2 refills | Status: DC

## 2024-07-10 MED ORDER — LUMATEPERONE TOSYLATE 42 MG PO CAPS: 42.0000 mg | ORAL_CAPSULE | Freq: Every day | ORAL | 2 refills | Status: DC

## 2024-07-10 MED ORDER — LISINOPRIL 5 MG PO TABS: 5.0000 mg | ORAL_TABLET | Freq: Every day | ORAL | 11 refills | Status: DC

## 2024-07-10 NOTE — Assessment & Plan Note (Signed)
 Her BP has been elevated.  We will continue on amlodipine  10mg  daily and add lisinopril 5mg  po daily.

## 2024-07-10 NOTE — Progress Notes (Signed)
 Office Visit  Subjective   Patient ID: Brandy Torres   DOB: 02-Nov-1945   Age: 78 y.o.   MRN: [id number]   Chief Complaint Chief Complaint  Patient presents with   Follow-up    Follow up     History of Present Illness The patient is a 78 year old female who returns today for followup of her depression and anxiety.   I saw her 3 months ago where her anxiety had worsened more than her depression.  I increased her sertraline  from 150mg  to 200mg  daily.  I also discontinued her xanax  and started her on clonazepam  0.5mg  BID prn.  We did continued her on buspar  15mg  TID.  She states that when she went up on the dose of sertraline  she started losing hair.  She was having side effects from depakote  with confusion and increased anxiousness.  I cut her depakote  ER back to 250mg  at night (she was on depakote  ER 500mg  BID) and we also increased her sertraline  from 100mg  to 150mg  daily and continued her on buspar  15mg  TID and xanax  as needed.  She took Depakote  ER 250mg  daily for about a week but she still had the same symptoms so she therefore stopped it.  She has some dizziness which she did not feel was due to the buspar  which she has been on for almost a year.  She got dizziness after developing diarrhea.  Today, she states her depression and her anxiety has worsened where her depression and anxiety are severe.  Her husband is in the hospital and this is adding to her stress.  She gets agitated and upset about things about her family.  She had manic depression post partum in 1973.  The patient has tried lexapro where this did not help.  She has tried citalopram, wellbutrin and prozac in the past.  The patient was on lithium in the 1970 up until 1980's.  She developed tardive dyskinesia and she stopped it.  She denies any mania at this time and is mostly depression with anxiety as described.  The patient is currently on zoloft  150mg  daily, buspar  15mg  TID, and xanax  0.25mg  BID prn which she is only taking once  per day.  She has been on abilifty and vraylar in the past but this made her aggressive.  She has been on seroquel in the past but she gained weight.  She reports extreme feelings of guilt, feelings of isolation, feelings of worthlessness, helpless feeling, fatigue, social withdrawal, and loss of interest in pleasurable activities.  She denies any problems with difficulty performing routine daily activities, insomnia, concentrating, fatigue, suicidal ideation, homicidal ideation, weight loss, loss of appetite, and no out of control feelings. This patient feels that she is able to care for herself. She currently lives with husband and her 65 yo grandson.  She has been admitted to a psychiatric facility twice in the past in 1972.  She received 8 electroshock in the hospital.  The second hospitalization was in 1977.  She is not seeing a psychiatrist or therapist now.  She does not want to see either.     Past Medical History Past Medical History:  Diagnosis Date   Acid reflux    Allergic rhinoconjunctivitis    Allergy    Anemia    Anxiety    Asthma    AV nodal re-entry tachycardia    Cardiac Ablation   Cataract    Chronic headaches    Colon polyps    Depression  Duodenal ulcer    Fibromyalgia    GERD (gastroesophageal reflux disease)    High blood pressure    High cholesterol    Hypothyroidism    IBS (irritable bowel syndrome)    Lymphoma (HCC)    multiple and benign per patient    Osteoarthritis    Osteoporosis    Radiculopathy, lumbar region 11/04/2023   UTI (urinary tract infection)      Allergies Allergies  Allergen Reactions   Olanzapine  Swelling   Avelox [Moxifloxacin Hcl In Nacl] Swelling    Throat swelling.   Benzoxonium Chloride [Benzoxonium]     Eyes red, pain, itching   Celebrex [Celecoxib] Other (See Comments)    Throat, mouth thickness   Depakote  [Divalproex  Sodium]     Trouble speaking, confusion   Dexilant  [Dexlansoprazole ] Other (See Comments)    Colitis    Doxycycline  Other (See Comments)    Mouth tingling Lip burning    Penicillins Other (See Comments)    Itching at injection site   Prevacid [Lansoprazole] Other (See Comments)    Colitis    Singulair  [Montelukast  Sodium] Other (See Comments)    Hair shedding     Medications  Current Outpatient Medications:    Acetaminophen (TYLENOL PO), Take 650 mg by mouth as needed (pain)., Disp: , Rfl:    amLODipine  (NORVASC ) 10 MG tablet, Take 1 tablet (10 mg total) by mouth daily., Disp: 90 tablet, Rfl: 1   azelastine  (ASTELIN ) 0.1 % nasal spray, Place 2 sprays into both nostrils 2 (two) times daily. Use in each nostril as directed, Disp: 30 mL, Rfl: 5   busPIRone  (BUSPAR ) 15 MG tablet, Take 1 tablet (15 mg total) by mouth 3 (three) times daily., Disp: 90 tablet, Rfl: 5   Calcium  Carb-Cholecalciferol 600-800 MG-UNIT TABS, Take 1 tablet by mouth 2 (two) times daily., Disp: , Rfl:    diphenoxylate -atropine  (LOMOTIL ) 2.5-0.025 MG tablet, Take 1 tablet by mouth 4 (four) times daily as needed for diarrhea or loose stools., Disp: 30 tablet, Rfl: 6   esomeprazole  (NEXIUM ) 40 MG capsule, Take 1 capsule (40 mg total) by mouth 2 (two) times daily before a meal. (Patient taking differently: Take 40 mg by mouth daily.), Disp: 60 capsule, Rfl: 3   famotidine (PEPCID) 20 MG tablet, Take 20 mg by mouth at bedtime., Disp: , Rfl:    fexofenadine (ALLEGRA) 180 MG tablet, Take 180 mg by mouth daily as needed for allergies or rhinitis., Disp: , Rfl:    FIBER PO, Take 1 tablet by mouth as needed (GI homeostatsis)., Disp: , Rfl:    Glucosamine HCl 1500 MG TABS, Take 1 tablet by mouth daily., Disp: , Rfl:    lisinopril (ZESTRIL) 5 MG tablet, Take 1 tablet (5 mg total) by mouth daily., Disp: 30 tablet, Rfl: 11   loperamide (IMODIUM) 2 MG capsule, Take by mouth as needed for diarrhea or loose stools., Disp: , Rfl:    lumateperone tosylate (CAPLYTA) 42 MG capsule, Take 1 capsule (42 mg total) by mouth daily., Disp: 30  capsule, Rfl: 2   Methylsulfonylmethane (MSM) 1500 MG TABS, Take 1 tablet by mouth 2 (two) times daily., Disp: , Rfl:    Multiple Vitamins-Minerals (EYE HEALTH PO), Take by mouth., Disp: , Rfl:    Multiple Vitamins-Minerals (MULTIVITAMIN ADULT PO), Take 1 tablet by mouth daily., Disp: , Rfl:    ondansetron  (ZOFRAN ) 4 MG tablet, Take 1 tablet (4 mg total) by mouth 2 (two) times daily as needed for nausea (diarrhea)., Disp: 60  tablet, Rfl: 2   potassium chloride  SA (KLOR-CON  M) 20 MEQ tablet, Take 1 tablet by mouth twice daily, Disp: 180 tablet, Rfl: 0   Probiotic Product (PHILLIPS COLON HEALTH) CAPS, Take by mouth daily., Disp: , Rfl:    rosuvastatin  (CRESTOR ) 40 MG tablet, Take 1 tablet (40 mg total) by mouth daily., Disp: 90 tablet, Rfl: 1   sertraline  (ZOLOFT ) 100 MG tablet, Take 2 tablets (200 mg total) by mouth daily., Disp: 180 tablet, Rfl: 1   sertraline  (ZOLOFT ) 100 MG tablet, Take 1.5 tablets (150 mg total) by mouth daily., Disp: 135 tablet, Rfl: 2   clonazePAM  (KLONOPIN ) 0.5 MG tablet, Take 1 tablet (0.5 mg total) by mouth 2 (two) times daily as needed for anxiety., Disp: 60 tablet, Rfl: 1   levothyroxine  (SYNTHROID ) 125 MCG tablet, Take 1 tablet (125 mcg total) by mouth daily., Disp: 30 tablet, Rfl: 0   Review of Systems Review of Systems  Constitutional:  Negative for chills and fever.  Eyes:  Negative for blurred vision.  Respiratory:  Negative for shortness of breath.   Cardiovascular:  Negative for chest pain.  Gastrointestinal:  Positive for diarrhea. Negative for abdominal pain, constipation, nausea and vomiting.  Neurological:  Negative for dizziness, weakness and headaches.       Objective:    Vitals BP (!) 180/84   Pulse 72   Temp 97.7 F (36.5 C) (Temporal)   Resp 16   Ht 4' 11 (1.499 m)   Wt 165 lb 9.6 oz (75.1 kg)   SpO2 99%   BMI 33.45 kg/m    Physical Examination Physical Exam Constitutional:      Appearance: Normal appearance. She is not  ill-appearing.  Cardiovascular:     Rate and Rhythm: Normal rate and regular rhythm.     Pulses: Normal pulses.     Heart sounds: No murmur heard.    No friction rub. No gallop.  Pulmonary:     Effort: Pulmonary effort is normal. No respiratory distress.     Breath sounds: No wheezing, rhonchi or rales.  Abdominal:     General: Bowel sounds are normal. There is no distension.     Palpations: Abdomen is soft.     Tenderness: There is no abdominal tenderness.  Musculoskeletal:     Right lower leg: No edema.     Left lower leg: No edema.  Skin:    General: Skin is warm and dry.     Findings: No rash.  Neurological:     Mental Status: She is alert.  Psychiatric:        Mood and Affect: Mood normal.        Behavior: Behavior normal.        Assessment & Plan:   Essential hypertension Her BP has been elevated.  We will continue on amlodipine  10mg  daily and add lisinopril 5mg  po daily.  Severe episode of recurrent major depressive disorder, without psychotic features (HCC) She has severe recurrent major depression that has worsened.  I am going to cut her sertraline  back down to 150mg  daily and add caplyta as an adjunct for her depression and start at 42mg  po daily.  I gave her samples.  I will see her back in 2 months.    Return in about 2 months (around 09/09/2024).   Selinda Fleeta Finger, MD

## 2024-07-10 NOTE — Assessment & Plan Note (Signed)
 She has severe recurrent major depression that has worsened.  I am going to cut her sertraline  back down to 150mg  daily and add caplyta as an adjunct for her depression and start at 42mg  po daily.  I gave her samples.  I will see her back in 2 months.

## 2024-07-13 NOTE — Addendum Note (Signed)
 Addended by: LENETTA LACKS on: 07/13/2024 10:01 AM   Modules accepted: Orders

## 2024-07-16 ENCOUNTER — Other Ambulatory Visit: Payer: Self-pay | Admitting: Internal Medicine

## 2024-07-16 MED ORDER — CLONAZEPAM 0.5 MG PO TABS: 0.5000 mg | ORAL_TABLET | Freq: Two times a day (BID) | ORAL | 1 refills | Status: AC | PRN

## 2024-07-27 ENCOUNTER — Other Ambulatory Visit: Payer: Self-pay | Admitting: Internal Medicine

## 2024-08-20 ENCOUNTER — Other Ambulatory Visit: Payer: Self-pay | Admitting: Physician Assistant

## 2024-08-30 ENCOUNTER — Other Ambulatory Visit: Payer: Self-pay | Admitting: Internal Medicine

## 2024-09-07 ENCOUNTER — Encounter: Payer: Self-pay | Admitting: Internal Medicine

## 2024-09-07 ENCOUNTER — Ambulatory Visit: Admitting: Internal Medicine

## 2024-09-07 VITALS — BP 140/90 | HR 52 | Temp 97.8°F | Resp 18 | Ht 59.0 in | Wt 172.0 lb

## 2024-09-07 DIAGNOSIS — I10 Essential (primary) hypertension: Secondary | ICD-10-CM

## 2024-09-07 DIAGNOSIS — F332 Major depressive disorder, recurrent severe without psychotic features: Secondary | ICD-10-CM | POA: Diagnosis not present

## 2024-09-07 DIAGNOSIS — F411 Generalized anxiety disorder: Secondary | ICD-10-CM | POA: Diagnosis not present

## 2024-09-07 DIAGNOSIS — E890 Postprocedural hypothyroidism: Secondary | ICD-10-CM | POA: Diagnosis not present

## 2024-09-07 MED ORDER — LOSARTAN POTASSIUM 25 MG PO TABS: 25.0000 mg | ORAL_TABLET | Freq: Every day | ORAL | 3 refills | Status: AC

## 2024-09-07 MED ORDER — REXULTI 0.5 MG PO TABS: 1.0000 | ORAL_TABLET | Freq: Every evening | ORAL | Status: AC

## 2024-09-07 NOTE — Progress Notes (Signed)
 "  Office Visit  Subjective   Patient ID: Brandy  Brandy Torres   DOB: [birth year]   Age: 78 y.o.   MRN: [id number]   Chief Complaint Chief Complaint  Patient presents with   Follow-up    2 month follow up     History of Present Illness 78 years old female is here for follow up. She  has  major depression, she takes sertraline  200 mg daily but she feels that she still depressed.  She has a history of bipolar disorder and she was started on Depakote  that was escalated to 500 mg twice a day and sertraline  dose was increased from 100-150 and now 200 mg daily.  She denies having any suicidal ideation.  She also takes buspirone  15 mg 3 times a day as needed for anxiety.  She also takes alprazolam  as needed.  Her Xanax  was changed to clonazepam  0.5 mg twice a day.    She has hypertension and she takes amlodipine  10 mg daily.  Her blood pressure is controlled.  Her renal and kidney function were reviewed and both were normal.    She also has a history of hypothyroidism and she takes levothyroxine  125 mcg daily.  Her TSH on April 27, 2024 was 4.2 that was therapeutic.  Past Medical History Past Medical History:  Diagnosis Date   Acid reflux    Allergic rhinoconjunctivitis    Allergy    Anemia    Anxiety    Asthma    AV nodal re-entry tachycardia    Cardiac Ablation   Cataract    Chronic headaches    Colon polyps    Depression    Duodenal ulcer    Fibromyalgia    GERD (gastroesophageal reflux disease)    High blood pressure    High cholesterol    Hypothyroidism    IBS (irritable bowel syndrome)    Lymphoma (HCC)    multiple and benign per patient    Osteoarthritis    Osteoporosis    Radiculopathy, lumbar region 11/04/2023   UTI (urinary tract infection)      Allergies Allergies[1]   Review of Systems Review of Systems  Constitutional: Negative.   HENT: Negative.    Respiratory: Negative.    Cardiovascular: Negative.   Gastrointestinal: Negative.   Neurological: Negative.    Psychiatric/Behavioral:  Positive for depression.        Objective:    Vitals BP (!) 140/90   Pulse (!) 52   Temp 97.8 F (36.6 C)   Resp 18   Ht 4' 11 (1.499 m)   Wt 172 lb (78 kg)   SpO2 98%   BMI 34.74 kg/m    Physical Examination Physical Exam Constitutional:      Appearance: Normal appearance. She is obese.  Cardiovascular:     Rate and Rhythm: Normal rate and regular rhythm.     Heart sounds: Normal heart sounds.  Abdominal:     General: Bowel sounds are normal.     Palpations: Abdomen is soft.  Neurological:     General: No focal deficit present.     Mental Status: She is alert and oriented to person, place, and time.        Assessment & Plan:   Essential hypertension   Her blood pressure is well controlled.  Hypothyroidism   She takes levothyroxine  125 mcg daily and her TSH level in August was therapeutic.  Severe episode of recurrent major depressive disorder, without psychotic features (HCC)   She has  a depression with history of bipolar I will add Rexulti  0.5 mg daily and samples were given.  She do not have any suicidal ideation.  She will call if he has any side effects or suicidal ideation.  GAD (generalized anxiety disorder)   She takes clonazepam  0.5 mg twice a day and buspirone  3 times a day.  Will continue to monitor.    Return in about 1 month (around 10/08/2024).   Roetta Dare, MD      [1]  Allergies Allergen Reactions   Olanzapine  Swelling   Avelox [Moxifloxacin Hcl In Nacl] Swelling    Throat swelling.   Benzoxonium Chloride [Benzoxonium]     Eyes red, pain, itching   Celebrex [Celecoxib] Other (See Comments)    Throat, mouth thickness   Depakote  [Divalproex  Sodium]     Trouble speaking, confusion   Dexilant  [Dexlansoprazole ] Other (See Comments)    Colitis   Doxycycline  Other (See Comments)    Mouth tingling Lip burning    Penicillins Other (See Comments)    Itching at injection site   Prevacid [Lansoprazole] Other (See  Comments)    Colitis    Singulair  [Montelukast  Sodium] Other (See Comments)    Hair shedding   "

## 2024-09-08 ENCOUNTER — Ambulatory Visit: Admitting: Internal Medicine

## 2024-09-08 ENCOUNTER — Other Ambulatory Visit: Payer: Self-pay

## 2024-09-12 NOTE — Assessment & Plan Note (Signed)
 Her blood pressure is well controlled.

## 2024-09-12 NOTE — Assessment & Plan Note (Signed)
"    She takes clonazepam  0.5 mg twice a day and buspirone  3 times a day.  Will continue to monitor. "

## 2024-09-12 NOTE — Assessment & Plan Note (Signed)
"    She takes levothyroxine  125 mcg daily and her TSH level in August was therapeutic. "

## 2024-09-12 NOTE — Assessment & Plan Note (Signed)
"    She has a depression with history of bipolar I will add Rexulti  0.5 mg daily and samples were given.  She do not have any suicidal ideation.  She will call if he has any side effects or suicidal ideation. "

## 2024-09-14 ENCOUNTER — Ambulatory Visit: Admitting: Internal Medicine

## 2024-09-14 VITALS — BP 124/60

## 2024-09-14 DIAGNOSIS — I10 Essential (primary) hypertension: Secondary | ICD-10-CM

## 2024-09-14 NOTE — Progress Notes (Signed)
 Blood draw , BP check

## 2024-09-15 LAB — BASIC METABOLIC PANEL WITH GFR
BUN/Creatinine Ratio: 26 (ref 12–28)
BUN: 22 mg/dL (ref 8–27)
CO2: 21 mmol/L (ref 20–29)
Calcium: 9.5 mg/dL (ref 8.7–10.3)
Chloride: 103 mmol/L (ref 96–106)
Creatinine, Ser: 0.84 mg/dL (ref 0.57–1.00)
Glucose: 111 mg/dL — ABNORMAL HIGH (ref 70–99)
Potassium: 4.7 mmol/L (ref 3.5–5.2)
Sodium: 138 mmol/L (ref 134–144)
eGFR: 71 mL/min/1.73

## 2024-09-21 ENCOUNTER — Ambulatory Visit: Admitting: Internal Medicine

## 2024-09-22 ENCOUNTER — Ambulatory Visit: Payer: Self-pay

## 2024-09-22 NOTE — Progress Notes (Signed)
 Patient called.  Patient aware. Her lab is good

## 2024-09-28 ENCOUNTER — Ambulatory Visit: Admitting: Internal Medicine

## 2024-09-29 ENCOUNTER — Other Ambulatory Visit: Payer: Self-pay | Admitting: Internal Medicine

## 2024-10-06 DIAGNOSIS — I459 Conduction disorder, unspecified: Secondary | ICD-10-CM | POA: Insufficient documentation

## 2024-10-06 DIAGNOSIS — I442 Atrioventricular block, complete: Secondary | ICD-10-CM | POA: Insufficient documentation

## 2024-10-14 ENCOUNTER — Encounter: Payer: Self-pay | Admitting: Cardiology

## 2024-10-14 ENCOUNTER — Encounter: Payer: Self-pay | Admitting: *Deleted

## 2025-01-12 ENCOUNTER — Ambulatory Visit: Admitting: Cardiology
